# Patient Record
Sex: Female | Born: 1970 | Race: White | Hispanic: No | Smoking: Never smoker
Health system: Southern US, Community
[De-identification: ages and names within clinical notes are randomized; demographics above are authoritative.]

---

## 2008-10-19 ENCOUNTER — Emergency Department (HOSPITAL_COMMUNITY): Admission: EM | Admit: 2008-10-19 | Discharge: 2008-10-19 | Payer: Self-pay | Admitting: Family Medicine

## 2009-01-21 ENCOUNTER — Emergency Department (HOSPITAL_COMMUNITY): Admission: EM | Admit: 2009-01-21 | Discharge: 2009-01-21 | Payer: Self-pay | Admitting: Emergency Medicine

## 2009-03-25 ENCOUNTER — Emergency Department (HOSPITAL_COMMUNITY): Admission: EM | Admit: 2009-03-25 | Discharge: 2009-03-25 | Payer: Self-pay | Admitting: Emergency Medicine

## 2011-02-26 LAB — POCT URINALYSIS DIP (DEVICE)
Bilirubin Urine: NEGATIVE
Glucose, UA: NEGATIVE mg/dL
Ketones, ur: NEGATIVE mg/dL
Nitrite: POSITIVE — AB

## 2011-02-26 LAB — POCT PREGNANCY, URINE: Preg Test, Ur: NEGATIVE

## 2011-02-28 LAB — POCT URINALYSIS DIP (DEVICE)
Glucose, UA: NEGATIVE mg/dL
Ketones, ur: NEGATIVE mg/dL
Specific Gravity, Urine: 1.015 (ref 1.005–1.030)

## 2018-01-15 ENCOUNTER — Other Ambulatory Visit: Payer: Self-pay | Admitting: Family Medicine

## 2018-01-15 DIAGNOSIS — Z1231 Encounter for screening mammogram for malignant neoplasm of breast: Secondary | ICD-10-CM

## 2018-02-04 ENCOUNTER — Ambulatory Visit
Admission: RE | Admit: 2018-02-04 | Discharge: 2018-02-04 | Disposition: A | Discharge status: Home / Self Care | Payer: Medicaid Other | Source: Ambulatory Visit | Attending: Family Medicine | Admitting: Family Medicine

## 2018-02-04 DIAGNOSIS — Z1231 Encounter for screening mammogram for malignant neoplasm of breast: Secondary | ICD-10-CM

## 2018-02-05 ENCOUNTER — Other Ambulatory Visit: Payer: Self-pay | Admitting: Family Medicine

## 2018-02-05 DIAGNOSIS — R928 Other abnormal and inconclusive findings on diagnostic imaging of breast: Secondary | ICD-10-CM

## 2018-02-10 ENCOUNTER — Ambulatory Visit
Admission: RE | Admit: 2018-02-10 | Discharge: 2018-02-10 | Disposition: A | Discharge status: Home / Self Care | Payer: Medicaid Other | Source: Ambulatory Visit | Attending: Family Medicine | Admitting: Family Medicine

## 2018-02-10 DIAGNOSIS — R928 Other abnormal and inconclusive findings on diagnostic imaging of breast: Secondary | ICD-10-CM

## 2018-03-26 ENCOUNTER — Emergency Department (HOSPITAL_COMMUNITY): Payer: Medicaid Other | Admitting: Anesthesiology

## 2018-03-26 ENCOUNTER — Emergency Department (HOSPITAL_COMMUNITY): Payer: Medicaid Other

## 2018-03-26 ENCOUNTER — Other Ambulatory Visit: Payer: Self-pay

## 2018-03-26 ENCOUNTER — Inpatient Hospital Stay (HOSPITAL_COMMUNITY): Payer: Medicaid Other

## 2018-03-26 ENCOUNTER — Encounter (HOSPITAL_COMMUNITY)
Admission: EM | Disposition: A | Discharge status: Home / Self Care | Payer: Self-pay | Source: Home / Self Care | Attending: Orthopedic Surgery

## 2018-03-26 ENCOUNTER — Inpatient Hospital Stay (HOSPITAL_COMMUNITY)
Admission: EM | Admit: 2018-03-26 | Discharge: 2018-03-29 | DRG: 494 | Disposition: A | Discharge status: Home / Self Care | Payer: Medicaid Other | Attending: Orthopedic Surgery | Admitting: Orthopedic Surgery

## 2018-03-26 ENCOUNTER — Encounter (HOSPITAL_COMMUNITY): Payer: Self-pay

## 2018-03-26 DIAGNOSIS — Z09 Encounter for follow-up examination after completed treatment for conditions other than malignant neoplasm: Secondary | ICD-10-CM

## 2018-03-26 DIAGNOSIS — W19XXXA Unspecified fall, initial encounter: Secondary | ICD-10-CM | POA: Diagnosis present

## 2018-03-26 DIAGNOSIS — S82209A Unspecified fracture of shaft of unspecified tibia, initial encounter for closed fracture: Secondary | ICD-10-CM

## 2018-03-26 DIAGNOSIS — S82251A Displaced comminuted fracture of shaft of right tibia, initial encounter for closed fracture: Secondary | ICD-10-CM | POA: Diagnosis present

## 2018-03-26 HISTORY — PX: TIBIA IM NAIL INSERTION: SHX2516

## 2018-03-26 LAB — BASIC METABOLIC PANEL
ANION GAP: 10 (ref 5–15)
BUN: 8 mg/dL (ref 6–20)
CHLORIDE: 110 mmol/L (ref 101–111)
CO2: 21 mmol/L — ABNORMAL LOW (ref 22–32)
Calcium: 8.6 mg/dL — ABNORMAL LOW (ref 8.9–10.3)
Creatinine, Ser: 0.87 mg/dL (ref 0.44–1.00)
GFR calc Af Amer: >60 mL/min (ref >60)
Glucose, Bld: 102 mg/dL — ABNORMAL HIGH (ref 65–99)
POTASSIUM: 4 mmol/L (ref 3.5–5.1)
SODIUM: 141 mmol/L (ref 135–145)

## 2018-03-26 LAB — CBC
HCT: 35.1 % — ABNORMAL LOW (ref 36.0–46.0)
HEMOGLOBIN: 11.7 g/dL — AB (ref 12.0–15.0)
MCH: 32.8 pg (ref 26.0–34.0)
MCHC: 33.3 g/dL (ref 30.0–36.0)
MCV: 98.3 fL (ref 78.0–100.0)
PLATELETS: 292 10*3/uL (ref 150–400)
RBC: 3.57 MIL/uL — AB (ref 3.87–5.11)
RDW: 12.7 % (ref 11.5–15.5)
WBC: 5.7 10*3/uL (ref 4.0–10.5)

## 2018-03-26 LAB — ABO/RH: ABO/RH(D): A POS

## 2018-03-26 LAB — TYPE AND SCREEN
ABO/RH(D): A POS
ANTIBODY SCREEN: NEGATIVE

## 2018-03-26 LAB — MRSA PCR SCREENING: MRSA by PCR: NEGATIVE

## 2018-03-26 SURGERY — INSERTION, INTRAMEDULLARY ROD, TIBIA
Anesthesia: General | Site: Leg Lower | Laterality: Right

## 2018-03-26 MED ORDER — DEXAMETHASONE SODIUM PHOSPHATE 10 MG/ML IJ SOLN
INTRAMUSCULAR | Status: AC
  Filled 2018-03-26: qty 1

## 2018-03-26 MED ORDER — LIDOCAINE 2% (20 MG/ML) 5 ML SYRINGE
INTRAMUSCULAR | Status: DC | PRN
  Administered 2018-03-26: 60 mg via INTRAVENOUS

## 2018-03-26 MED ORDER — FENTANYL CITRATE (PF) 250 MCG/5ML IJ SOLN
INTRAMUSCULAR | Status: AC
  Filled 2018-03-26: qty 5

## 2018-03-26 MED ORDER — STERILE WATER FOR IRRIGATION IR SOLN
Status: DC | PRN
  Administered 2018-03-26: 2000 mL

## 2018-03-26 MED ORDER — METHOCARBAMOL 1000 MG/10ML IJ SOLN
500.0000 mg | Freq: Four times a day (QID) | INTRAVENOUS | Status: DC | PRN
  Administered 2018-03-26: 500 mg via INTRAVENOUS
  Filled 2018-03-26: qty 550

## 2018-03-26 MED ORDER — HYDROMORPHONE HCL 1 MG/ML IJ SOLN
INTRAMUSCULAR | Status: AC
  Filled 2018-03-26: qty 1

## 2018-03-26 MED ORDER — PROPOFOL 10 MG/ML IV BOLUS
INTRAVENOUS | Status: DC | PRN
  Administered 2018-03-26: 150 mg via INTRAVENOUS

## 2018-03-26 MED ORDER — MIDAZOLAM HCL 2 MG/2ML IJ SOLN
INTRAMUSCULAR | Status: AC
  Filled 2018-03-26: qty 2

## 2018-03-26 MED ORDER — ENOXAPARIN SODIUM 40 MG/0.4ML ~~LOC~~ SOLN
40.0000 mg | SUBCUTANEOUS | Status: DC
  Administered 2018-03-28 – 2018-03-29 (×2): 40 mg via SUBCUTANEOUS
  Filled 2018-03-26 (×2): qty 0.4

## 2018-03-26 MED ORDER — KETOROLAC TROMETHAMINE 15 MG/ML IJ SOLN
15.0000 mg | Freq: Four times a day (QID) | INTRAMUSCULAR | Status: AC | PRN
  Administered 2018-03-27 – 2018-03-28 (×4): 15 mg via INTRAVENOUS
  Filled 2018-03-26 (×4): qty 1

## 2018-03-26 MED ORDER — OXYCODONE HCL 5 MG PO TABS: 5.0000 mg | ORAL_TABLET | Freq: Once | ORAL | Status: DC | PRN

## 2018-03-26 MED ORDER — BUPIVACAINE-EPINEPHRINE (PF) 0.5% -1:200000 IJ SOLN
INTRAMUSCULAR | Status: DC | PRN
  Administered 2018-03-26: 25 mL via PERINEURAL

## 2018-03-26 MED ORDER — ONDANSETRON HCL 4 MG/2ML IJ SOLN: 4.0000 mg | Freq: Four times a day (QID) | INTRAMUSCULAR | Status: DC | PRN

## 2018-03-26 MED ORDER — HYDROMORPHONE HCL 1 MG/ML IJ SOLN
1.0000 mg | Freq: Once | INTRAMUSCULAR | Status: AC
  Administered 2018-03-26: 1 mg via INTRAVENOUS
  Filled 2018-03-26: qty 1

## 2018-03-26 MED ORDER — CEFAZOLIN SODIUM-DEXTROSE 2-4 GM/100ML-% IV SOLN
2.0000 g | Freq: Four times a day (QID) | INTRAVENOUS | Status: AC
  Administered 2018-03-26 – 2018-03-27 (×3): 2 g via INTRAVENOUS
  Filled 2018-03-26 (×3): qty 100

## 2018-03-26 MED ORDER — OXYCODONE HCL 5 MG/5ML PO SOLN
5.0000 mg | Freq: Once | ORAL | Status: DC | PRN
  Filled 2018-03-26: qty 5

## 2018-03-26 MED ORDER — ONDANSETRON HCL 4 MG/2ML IJ SOLN
INTRAMUSCULAR | Status: DC | PRN
  Administered 2018-03-26: 4 mg via INTRAVENOUS

## 2018-03-26 MED ORDER — ONDANSETRON HCL 4 MG/2ML IJ SOLN
INTRAMUSCULAR | Status: AC
  Filled 2018-03-26: qty 2

## 2018-03-26 MED ORDER — LIDOCAINE 2% (20 MG/ML) 5 ML SYRINGE
INTRAMUSCULAR | Status: AC
  Filled 2018-03-26: qty 5

## 2018-03-26 MED ORDER — METOCLOPRAMIDE HCL 5 MG/ML IJ SOLN: 5.0000 mg | Freq: Three times a day (TID) | INTRAMUSCULAR | Status: DC | PRN

## 2018-03-26 MED ORDER — CEFAZOLIN SODIUM-DEXTROSE 2-4 GM/100ML-% IV SOLN
2.0000 g | INTRAVENOUS | Status: AC
  Administered 2018-03-26: 2 g via INTRAVENOUS

## 2018-03-26 MED ORDER — HYDROMORPHONE HCL 1 MG/ML IJ SOLN
0.2500 mg | INTRAMUSCULAR | Status: DC | PRN
  Administered 2018-03-26 (×4): 0.5 mg via INTRAVENOUS

## 2018-03-26 MED ORDER — SUCCINYLCHOLINE CHLORIDE 200 MG/10ML IV SOSY
PREFILLED_SYRINGE | INTRAVENOUS | Status: AC
  Filled 2018-03-26: qty 10

## 2018-03-26 MED ORDER — SENNA 8.6 MG PO TABS
1.0000 | ORAL_TABLET | Freq: Two times a day (BID) | ORAL | Status: DC
  Administered 2018-03-26 – 2018-03-29 (×6): 8.6 mg via ORAL
  Filled 2018-03-26 (×6): qty 1

## 2018-03-26 MED ORDER — PHENYLEPHRINE 40 MCG/ML (10ML) SYRINGE FOR IV PUSH (FOR BLOOD PRESSURE SUPPORT)
PREFILLED_SYRINGE | INTRAVENOUS | Status: AC
  Filled 2018-03-26: qty 10

## 2018-03-26 MED ORDER — ONDANSETRON HCL 4 MG/2ML IJ SOLN
4.0000 mg | Freq: Once | INTRAMUSCULAR | Status: AC
  Administered 2018-03-26: 4 mg via INTRAVENOUS

## 2018-03-26 MED ORDER — SODIUM CHLORIDE 0.9 % IV SOLN
INTRAVENOUS | Status: DC
  Administered 2018-03-26 – 2018-03-27 (×2): via INTRAVENOUS

## 2018-03-26 MED ORDER — METOCLOPRAMIDE HCL 5 MG PO TABS: 5.0000 mg | ORAL_TABLET | Freq: Three times a day (TID) | ORAL | Status: DC | PRN

## 2018-03-26 MED ORDER — MORPHINE SULFATE (PF) 2 MG/ML IV SOLN
0.5000 mg | INTRAVENOUS | Status: DC | PRN
  Administered 2018-03-26 – 2018-03-27 (×3): 1 mg via INTRAVENOUS
  Filled 2018-03-26 (×3): qty 1

## 2018-03-26 MED ORDER — ROCURONIUM BROMIDE 10 MG/ML (PF) SYRINGE
PREFILLED_SYRINGE | INTRAVENOUS | Status: AC
  Filled 2018-03-26: qty 5

## 2018-03-26 MED ORDER — METHOCARBAMOL 500 MG PO TABS
500.0000 mg | ORAL_TABLET | Freq: Four times a day (QID) | ORAL | Status: DC | PRN
  Administered 2018-03-26 – 2018-03-29 (×10): 500 mg via ORAL
  Filled 2018-03-26 (×10): qty 1

## 2018-03-26 MED ORDER — DOCUSATE SODIUM 100 MG PO CAPS
100.0000 mg | ORAL_CAPSULE | Freq: Two times a day (BID) | ORAL | Status: DC
  Administered 2018-03-26 – 2018-03-29 (×6): 100 mg via ORAL
  Filled 2018-03-26 (×6): qty 1

## 2018-03-26 MED ORDER — CHLORHEXIDINE GLUCONATE 4 % EX LIQD
60.0000 mL | Freq: Once | CUTANEOUS | Status: DC
  Filled 2018-03-26: qty 60

## 2018-03-26 MED ORDER — FENTANYL CITRATE (PF) 100 MCG/2ML IJ SOLN
INTRAMUSCULAR | Status: AC
  Filled 2018-03-26: qty 2

## 2018-03-26 MED ORDER — FENTANYL CITRATE (PF) 100 MCG/2ML IJ SOLN
INTRAMUSCULAR | Status: DC | PRN
  Administered 2018-03-26 (×5): 50 ug via INTRAVENOUS

## 2018-03-26 MED ORDER — PHENYLEPHRINE 40 MCG/ML (10ML) SYRINGE FOR IV PUSH (FOR BLOOD PRESSURE SUPPORT)
PREFILLED_SYRINGE | INTRAVENOUS | Status: DC | PRN
  Administered 2018-03-26 (×4): 80 ug via INTRAVENOUS

## 2018-03-26 MED ORDER — DIPHENHYDRAMINE HCL 12.5 MG/5ML PO ELIX: 12.5000 mg | ORAL_SOLUTION | ORAL | Status: DC | PRN

## 2018-03-26 MED ORDER — PHENYLEPHRINE HCL 10 MG/ML IJ SOLN
INTRAMUSCULAR | Status: AC
  Filled 2018-03-26: qty 1

## 2018-03-26 MED ORDER — POVIDONE-IODINE 10 % EX SWAB: 2.0000 "application " | Freq: Once | CUTANEOUS | Status: DC

## 2018-03-26 MED ORDER — MORPHINE SULFATE (PF) 4 MG/ML IV SOLN
6.0000 mg | Freq: Once | INTRAVENOUS | Status: AC
  Administered 2018-03-26: 6 mg via INTRAVENOUS
  Filled 2018-03-26: qty 2

## 2018-03-26 MED ORDER — HYDROCODONE-ACETAMINOPHEN 7.5-325 MG PO TABS
1.0000 | ORAL_TABLET | ORAL | Status: DC | PRN
  Administered 2018-03-27 – 2018-03-28 (×8): 2 via ORAL
  Filled 2018-03-26 (×8): qty 2

## 2018-03-26 MED ORDER — HYDROMORPHONE HCL 1 MG/ML IJ SOLN
0.2500 mg | INTRAMUSCULAR | Status: DC | PRN
  Administered 2018-03-26 (×2): 0.5 mg via INTRAVENOUS

## 2018-03-26 MED ORDER — CEFAZOLIN SODIUM-DEXTROSE 2-4 GM/100ML-% IV SOLN
INTRAVENOUS | Status: AC
  Filled 2018-03-26: qty 100

## 2018-03-26 MED ORDER — PROPOFOL 10 MG/ML IV BOLUS
INTRAVENOUS | Status: AC
  Filled 2018-03-26: qty 20

## 2018-03-26 MED ORDER — ONDANSETRON HCL 4 MG PO TABS
4.0000 mg | ORAL_TABLET | Freq: Four times a day (QID) | ORAL | Status: DC | PRN
  Administered 2018-03-28: 4 mg via ORAL
  Filled 2018-03-26: qty 1

## 2018-03-26 MED ORDER — HYDROCODONE-ACETAMINOPHEN 5-325 MG PO TABS
1.0000 | ORAL_TABLET | ORAL | Status: DC | PRN
  Administered 2018-03-26: 2 via ORAL
  Filled 2018-03-26: qty 2

## 2018-03-26 MED ORDER — LACTATED RINGERS IV SOLN
INTRAVENOUS | Status: DC
  Administered 2018-03-26: 1000 mL via INTRAVENOUS
  Administered 2018-03-26: 14:00:00 via INTRAVENOUS

## 2018-03-26 MED ORDER — ACETAMINOPHEN 325 MG PO TABS
325.0000 mg | ORAL_TABLET | Freq: Four times a day (QID) | ORAL | Status: DC | PRN
  Administered 2018-03-28 – 2018-03-29 (×2): 650 mg via ORAL
  Filled 2018-03-26 (×2): qty 2

## 2018-03-26 SURGICAL SUPPLY — 55 items
BAG ZIPLOCK 12X15 (MISCELLANEOUS) ×3 IMPLANT
BANDAGE ACE 4X5 VEL STRL LF (GAUZE/BANDAGES/DRESSINGS) ×3 IMPLANT
BANDAGE ACE 6X5 VEL STRL LF (GAUZE/BANDAGES/DRESSINGS) ×3 IMPLANT
BIT DRILL 3.8X6 NS (BIT) ×3 IMPLANT
BIT DRILL 4.4 NS (BIT) ×3 IMPLANT
CHLORAPREP W/TINT 26ML (MISCELLANEOUS) ×6 IMPLANT
COVER SURGICAL LIGHT HANDLE (MISCELLANEOUS) ×3 IMPLANT
CUFF TOURN SGL QUICK 34 (TOURNIQUET CUFF)
CUFF TRNQT CYL 34X4X40X1 (TOURNIQUET CUFF) IMPLANT
DRAPE C-ARM 42X120 X-RAY (DRAPES) ×3 IMPLANT
DRAPE C-ARMOR (DRAPES) ×3 IMPLANT
DRAPE EXTREMITY TIBURON (DRAPES) ×3 IMPLANT
DRAPE POUCH INSTRU U-SHP 10X18 (DRAPES) ×3 IMPLANT
DRAPE SHEET LG 3/4 BI-LAMINATE (DRAPES) ×6 IMPLANT
DRAPE U-SHAPE 47X51 STRL (DRAPES) ×3 IMPLANT
DRSG EMULSION OIL 3X3 NADH (GAUZE/BANDAGES/DRESSINGS) ×3 IMPLANT
DRSG MEPILEX BORDER 4X4 (GAUZE/BANDAGES/DRESSINGS) IMPLANT
DRSG MEPILEX BORDER 4X8 (GAUZE/BANDAGES/DRESSINGS) IMPLANT
DRSG PAD ABDOMINAL 8X10 ST (GAUZE/BANDAGES/DRESSINGS) ×9 IMPLANT
ELECT BLADE TIP CTD 4 INCH (ELECTRODE) ×3 IMPLANT
ELECT REM PT RETURN 15FT ADLT (MISCELLANEOUS) ×3 IMPLANT
END CAP UNIVERSAL 5MM (Trauma) ×3 IMPLANT
FACESHIELD WRAPAROUND (MASK) ×9 IMPLANT
GAUZE SPONGE 4X4 12PLY STRL (GAUZE/BANDAGES/DRESSINGS) ×3 IMPLANT
GLOVE BIO SURGEON STRL SZ8.5 (GLOVE) ×6 IMPLANT
GLOVE BIOGEL PI IND STRL 8.5 (GLOVE) ×1 IMPLANT
GLOVE BIOGEL PI INDICATOR 8.5 (GLOVE) ×2
GOWN SPEC L3 XXLG W/TWL (GOWN DISPOSABLE) ×3 IMPLANT
GOWN STRL REUS W/TWL LRG LVL3 (GOWN DISPOSABLE) ×3 IMPLANT
GUIDEPIN 3.2X17.5 THRD DISP (PIN) ×3 IMPLANT
GUIDEWIRE BALL NOSE 80CM (WIRE) ×3 IMPLANT
MANIFOLD NEPTUNE II (INSTRUMENTS) ×3 IMPLANT
NAIL TIBIAL 8MMX30CM (Nail) ×3 IMPLANT
NS IRRIG 1000ML POUR BTL (IV SOLUTION) ×3 IMPLANT
PACK TOTAL JOINT (CUSTOM PROCEDURE TRAY) ×3 IMPLANT
PAD CAST 4YDX4 CTTN HI CHSV (CAST SUPPLIES) ×1 IMPLANT
PADDING CAST ABS 6INX4YD NS (CAST SUPPLIES) ×2
PADDING CAST ABS COTTON 6X4 NS (CAST SUPPLIES) ×1 IMPLANT
PADDING CAST COTTON 4X4 STRL (CAST SUPPLIES) ×2
PADDING CAST COTTON 6X4 STRL (CAST SUPPLIES) ×6 IMPLANT
POSITIONER SURGICAL ARM (MISCELLANEOUS) ×3 IMPLANT
SCREW ACECAP 32MM (Screw) ×3 IMPLANT
SCREW ACECAP 38MM (Screw) ×3 IMPLANT
SCREW CORTICAL 5.5 35MM (Screw) ×3 IMPLANT
SCREW LCKING CORT 5.5X30 NSTRL (Screw) ×3 IMPLANT
SCREW PROXIMAL DEPUY (Screw) ×2 IMPLANT
SCREW PRXML FT 45X5.5XLCK NS (Screw) ×1 IMPLANT
STAPLER VISISTAT 35W (STAPLE) ×3 IMPLANT
SUT VIC AB 1 CT1 27 (SUTURE) ×4
SUT VIC AB 1 CT1 27XBRD ANTBC (SUTURE) ×2 IMPLANT
SUT VIC AB 2-0 CT1 27 (SUTURE) ×4
SUT VIC AB 2-0 CT1 TAPERPNT 27 (SUTURE) ×2 IMPLANT
TOWEL OR 17X26 10 PK STRL BLUE (TOWEL DISPOSABLE) ×6 IMPLANT
WATER STERILE IRR 1000ML POUR (IV SOLUTION) ×6 IMPLANT
WRAP KNEE MAXI GEL POST OP (GAUZE/BANDAGES/DRESSINGS) ×3 IMPLANT
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
## 2018-03-26 NOTE — Op Note (Signed)
OPERATIVE REPORT   03/26/2018  2:02 PM  PATIENT:  Sally Myers   SURGEON:  Jonette Pesa, MD  ASSISTANT: Skip Mayer, PA-C.   PREOPERATIVE DIAGNOSIS: Right tibia shaft fracture.  POSTOPERATIVE DIAGNOSIS:  Same.  PROCEDURE: Intramedullary fixation of right tibia.  ANESTHESIA:   GETA.  ANTIBIOTICS: 2 g Ancef.  IMPLANTS: Biomet versa nail tibial nail size 8 x 300 mm. 5 mm endcap. 4.5 mm distal interlocking screw x2. 5.5 proximal interlocking screw x2.  SPECIMENS: None.  COMPLICATIONS: None.  DISPOSITION: Stable to PACU.  SURGICAL INDICATIONS:  Octa Uplinger is a 47 y.o. female with a diagnosis of closed right tibia fracture.  We discussed the risks, benefits, and alternatives to surgical treatment.  The risks, benefits, and alternatives were discussed with the patient preoperatively including but not limited to the risks of infection, bleeding, nerve / blood vessel injury, malunion, nonunion, cardiopulmonary complications, the need for repeat surgery, among others, and the patient was willing to proceed.  PROCEDURE IN DETAIL: The patient was identified in the preop holding area using 2 identifiers.  Popliteal block was placed by anesthesia.  The patient was taken to the operating room, and placed supine on the operating room table.  General anesthesia was induced.  All bony prominences were well-padded.  She was placed in the semi-extended position on a bone foam positioner.  The right lower extremity was prepped and draped in a normal sterile surgical fashion.  Timeout was called, verifying site and site of surgery.  I began by making a longitudinal incision over the lateral edge of the patella.  Full-thickness skin flaps were created.  I created a limited lateral parapatellar arthrotomy.  Blunt dissection was performed to identify the interval between the patellar tendon and fat pad.  I used the awl to determine the standard starting point for a tibial nail.  Guidepin was  advanced.  Entry reamer was used.  The fracture was reduced with traction and rotation.  Clamp was placed percutaneously to help hold the reduction.  Reduction was confirmed with AP and lateral fluoroscopy views.  Guidewire was placed to the physeal scar at the ankle.  Guidewire was measured using lateral fluoroscopy at the knee.  I sequentially reamed up to a 9.5 mm reamer with excellent chatter.  Therefore an 8 x 300 mm nail was selected.  The nail was assembled on the jig on the back table.  The nail was passed without any difficulty.  Using perfect circle technique with fluoroscopy, I placed 2 distal interlocking screws.  Proximally, 2 interlocking screws were placed, taking care to protect the peroneal nerve.  I removed the jig.  An end cap was placed.  Final AP and lateral fluoroscopy views were obtained of the knee, fracture site, and ankle to confirm fracture reduction and hardware placement.  The wounds were copiously irrigated with saline.  The knee joint was additionally irrigated with copious saline.  The arthrotomy was closed with #1 Vicryl.  Deep dermal closure was performed with 2-0 interrupted Vicryl, staples for the skin.  At the end of the case, there is very minimal swelling.  Compartments were soft and compressible.  Sterile dressing followed by a bulky Jones dressing were placed.  She was extubated, transferred to the stretcher, and taken to the PACU in stable condition.  Sponge, needle, and instrument counts were correct at the end of the case x2.  There were no known complications.  POSTOPERATIVE PLAN: Postoperatively, she will be admitted to the hospital.  She  will have compartment checks.  Touchdown weightbearing right lower extremity with a walker.  Lovenox in-house, aspirin upon discharge for DVT prophylaxis.  She will work with physical therapy.  She will undergo disposition planning.

## 2018-03-26 NOTE — Progress Notes (Signed)
Assisted Dr. Hodierne with right, ultrasound guided, popliteal block. Side rails up, monitors on throughout procedure. See vital signs in flow sheet. Tolerated Procedure well. 
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
## 2018-03-26 NOTE — Discharge Instructions (Signed)
Touchdown weightbearing right lower extremity. Apply ice for 20 minutes to the right lower extremity every 2 hours. Elevate toes above nose on blanket/pillows to help with swelling.

## 2018-03-26 NOTE — ED Notes (Signed)
Pt reports drinking about 3 glasses of wine and using marijuana before EMS arrived.

## 2018-03-26 NOTE — Anesthesia Preprocedure Evaluation (Signed)
Anesthesia Evaluation  Patient identified by MRN, date of birth, ID band Patient awake    Reviewed: Allergy & Precautions, H&P , NPO status , Patient's Chart, lab work & pertinent test results  Airway Mallampati: II   Neck ROM: full    Dental   Pulmonary neg pulmonary ROS,    breath sounds clear to auscultation       Cardiovascular negative cardio ROS   Rhythm:regular Rate:Normal     Neuro/Psych    GI/Hepatic   Endo/Other    Renal/GU      Musculoskeletal   Abdominal   Peds  Hematology   Anesthesia Other Findings   Reproductive/Obstetrics                             Anesthesia Physical Anesthesia Plan  ASA: I  Anesthesia Plan: General   Post-op Pain Management:  Regional for Post-op pain   Induction: Intravenous  PONV Risk Score and Plan: 3 and Ondansetron, Dexamethasone, Midazolam, Treatment may vary due to age or medical condition and Scopolamine patch - Pre-op  Airway Management Planned: LMA  Additional Equipment:   Intra-op Plan:   Post-operative Plan:   Informed Consent: I have reviewed the patients History and Physical, chart, labs and discussed the procedure including the risks, benefits and alternatives for the proposed anesthesia with the patient or authorized representative who has indicated his/her understanding and acceptance.     Plan Discussed with: CRNA, Anesthesiologist and Surgeon  Anesthesia Plan Comments:         Anesthesia Quick Evaluation
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
## 2018-03-26 NOTE — Plan of Care (Signed)
Plan of care 

## 2018-03-26 NOTE — Anesthesia Procedure Notes (Signed)
Anesthesia Regional Block: Popliteal block   Pre-Anesthetic Checklist: ,, timeout performed, Correct Patient, Correct Site, Correct Laterality, Correct Procedure, Correct Position, site marked, Risks and benefits discussed,  Surgical consent,  Pre-op evaluation,  At surgeon's request and post-op pain management  Laterality: Right  Prep: chloraprep       Needles:  Injection technique: Single-shot  Needle Type: Echogenic Stimulator Needle          Additional Needles:   Procedures:, nerve stimulator,,,,,,,   Nerve Stimulator or Paresthesia:  Response: plantar flexion of foot, 0.45 mA,   Additional Responses:   Narrative:  Start time: 03/26/2018 11:50 AM End time: 03/26/2018 11:58 AM Injection made incrementally with aspirations every 5 mL.  Performed by: Personally   Additional Notes: Functioning IV was confirmed and monitors were applied.  A 90mm 21ga Arrow echogenic stimulator needle was used. Sterile prep and drape,hand hygiene and sterile gloves were used.  Negative aspiration and negative test dose prior to incremental administration of local anesthetic. The patient tolerated the procedure well.  Ultrasound guidance: relevent anatomy identified, needle position confirmed, local anesthetic spread visualized around nerve(s), vascular puncture avoided.  Image printed for medical record.

## 2018-03-26 NOTE — Transfer of Care (Signed)
Immediate Anesthesia Transfer of Care Note  Patient: Sally Myers  Procedure(s) Performed: INTRAMEDULLARY (IM) NAIL TIBIAL (Right Leg Lower)  Patient Location: PACU  Anesthesia Type:General  Level of Consciousness: awake, alert  and oriented  Airway & Oxygen Therapy: Patient Spontanous Breathing and Patient connected to face mask oxygen  Post-op Assessment: Report given to RN and Post -op Vital signs reviewed and stable  Post vital signs: Reviewed and stable  Last Vitals:  Vitals Value Taken Time  BP 136/89 03/26/2018  2:19 PM  Temp    Pulse 125 03/26/2018  2:22 PM  Resp 17 03/26/2018  2:22 PM  SpO2 100 % 03/26/2018  2:22 PM  Vitals shown include unvalidated device data.  Last Pain:  Vitals:   03/26/18 1209  TempSrc:   PainSc: Asleep      Patients Stated Pain Goal: 4 (03/26/18 1209)  Complications: No apparent anesthesia complications

## 2018-03-26 NOTE — Anesthesia Procedure Notes (Signed)
Procedure Name: LMA Insertion Date/Time: 03/26/2018 12:23 PM Performed by: Orest Dikes, CRNA Pre-anesthesia Checklist: Patient identified, Emergency Drugs available, Suction available and Patient being monitored Patient Re-evaluated:Patient Re-evaluated prior to induction Oxygen Delivery Method: Circle system utilized Preoxygenation: Pre-oxygenation with 100% oxygen Induction Type: IV induction LMA: LMA inserted LMA Size: 4.0 Number of attempts: 1 Placement Confirmation: positive ETCO2 and breath sounds checked- equal and bilateral Tube secured with: Tape Dental Injury: Teeth and Oropharynx as per pre-operative assessment

## 2018-03-27 ENCOUNTER — Encounter (HOSPITAL_COMMUNITY): Payer: Self-pay | Admitting: Orthopedic Surgery

## 2018-03-27 MED ORDER — ASPIRIN EC 325 MG PO TBEC: 325.0000 mg | DELAYED_RELEASE_TABLET | Freq: Two times a day (BID) | ORAL | 0 refills | Status: AC

## 2018-03-27 MED ORDER — HYDROMORPHONE HCL 1 MG/ML IJ SOLN
1.0000 mg | INTRAMUSCULAR | Status: DC | PRN
  Administered 2018-03-27 – 2018-03-28 (×6): 1 mg via INTRAVENOUS
  Filled 2018-03-27 (×7): qty 1

## 2018-03-27 MED ORDER — HYDROCODONE-ACETAMINOPHEN 7.5-325 MG PO TABS: 1.0000 | ORAL_TABLET | ORAL | 0 refills | Status: DC | PRN

## 2018-03-27 MED ORDER — ONDANSETRON HCL 4 MG PO TABS: 4.0000 mg | ORAL_TABLET | Freq: Four times a day (QID) | ORAL | 0 refills | Status: AC | PRN

## 2018-03-27 MED ORDER — SENNA 8.6 MG PO TABS: 2.0000 | ORAL_TABLET | Freq: Every day | ORAL | 3 refills | Status: AC

## 2018-03-27 MED ORDER — DOCUSATE SODIUM 100 MG PO CAPS: 100.0000 mg | ORAL_CAPSULE | Freq: Two times a day (BID) | ORAL | 1 refills | Status: AC

## 2018-03-27 MED ORDER — SENNA 8.6 MG PO TABS: 1.0000 | ORAL_TABLET | Freq: Two times a day (BID) | ORAL | 0 refills | Status: AC

## 2018-03-27 NOTE — Evaluation (Signed)
Physical Therapy Evaluation Patient Details Name: Sally Myers MRN: [id number] DOB: 1971/08/19 Today's Date: 03/27/2018   History of Present Illness  s/p R ORIF tib/fib fx; PMHx: drug use  Clinical Impression  Pt admitted with above diagnosis. Pt currently with functional limitations due to the deficits listed below (see PT Problem List).  Pt doing well, anxious regarding mobility but amb 30' this am; will continue to follow, should be ready for d/c in am if pain remains controlled;  Pt will benefit from skilled PT to increase their independence and safety with mobility to allow discharge to the venue listed below.       Follow Up Recommendations No PT follow up    Equipment Recommendations  Rolling walker with 5" wheels    Recommendations for Other Services       Precautions / Restrictions Precautions Precautions: Fall Restrictions RLE Weight Bearing: Touchdown weight bearing      Mobility  Bed Mobility Overal bed mobility: Needs Assistance Bed Mobility: Supine to Sit     Supine to sit: Supervision     General bed mobility comments: for safety, incr time  Transfers Overall transfer level: Needs assistance Equipment used: Rolling walker (2 wheeled) Transfers: Sit to/from Stand Sit to Stand: Min guard         General transfer comment: cues for hand placement and TDWB  Ambulation/Gait Ambulation/Gait assistance: Min guard Ambulation Distance (Feet): 30 Feet Assistive device: Rolling walker (2 wheeled)       General Gait Details: cues for sequence,  and TDWB, slow but steady gait  Stairs            Wheelchair Mobility    Modified Rankin (Stroke Patients Only)       Balance Overall balance assessment: Needs assistance Sitting-balance support: Feet supported;No upper extremity supported Sitting balance-Leahy Scale: Good       Standing balance-Leahy Scale: Poor Standing balance comment: reliant on UE support                            Pertinent Vitals/Pain Pain Assessment: 0-10 Pain Score: 3  Pain Location: right ankle/leg Pain Descriptors / Indicators: Aching;Throbbing Pain Intervention(s): Limited activity within patient's tolerance;Monitored during session;Premedicated before session;Repositioned    Home Living Family/patient expects to be discharged to:: Private residence Living Arrangements: Children(17 yo with mental challenges)   Type of Home: House(townhouse) Home Access: Stairs to enter Entrance Stairs-Rails: Dealer of Steps: 2 Home Layout: Two level   Additional Comments: borrowed crutches after last injury(patella fx)    Prior Function Level of Independence: Independent               Hand Dominance        Extremity/Trunk Assessment   Upper Extremity Assessment Upper Extremity Assessment: Overall WFL for tasks assessed    Lower Extremity Assessment Lower Extremity Assessment: RLE deficits/detail RLE Deficits / Details: knee flexion to 90* AROM; hip grossly WFL RLE: Unable to fully assess due to immobilization;Unable to fully assess due to pain       Communication   Communication: No difficulties  Cognition Arousal/Alertness: Awake/alert Behavior During Therapy: WFL for tasks assessed/performed Overall Cognitive Status: Within Functional Limits for tasks assessed                                        General Comments  Exercises     Assessment/Plan    PT Assessment Patient needs continued PT services  PT Problem List Decreased balance;Decreased knowledge of use of DME;Decreased mobility       PT Treatment Interventions DME instruction;Gait training;Stair training;Functional mobility training;Therapeutic activities;Therapeutic exercise;Patient/family education    PT Goals (Current goals can be found in the Care Plan section)  Acute Rehab PT Goals Patient Stated Goal: home, regain independence PT Goal Formulation:  With patient Time For Goal Achievement: 04/03/18 Potential to Achieve Goals: Good    Frequency 7X/week   Barriers to discharge        Co-evaluation               AM-PAC PT "6 Clicks" Daily Activity  Outcome Measure Difficulty turning over in bed (including adjusting bedclothes, sheets and blankets)?: A Little Difficulty moving from lying on back to sitting on the side of the bed? : A Little Difficulty sitting down on and standing up from a chair with arms (e.g., wheelchair, bedside commode, etc,.)?: A Little Help needed moving to and from a bed to chair (including a wheelchair)?: A Little Help needed walking in hospital room?: A Little Help needed climbing 3-5 steps with a railing? : A Lot 6 Click Score: 17    End of Session Equipment Utilized During Treatment: Gait belt Activity Tolerance: Patient tolerated treatment well Patient left: in chair;with call bell/phone within reach;with chair alarm set;with family/visitor present   PT Visit Diagnosis: Unsteadiness on feet (R26.81);Difficulty in walking, not elsewhere classified (R26.2)    Time: 1610-9604 PT Time Calculation (min) (ACUTE ONLY): 28 min   Charges:   PT Evaluation $PT Eval Low Complexity: 1 Low PT Treatments $Gait Training: 8-22 mins   PT G Codes:        Donata Clay 03/27/2018, 10:55 AM

## 2018-03-27 NOTE — Evaluation (Signed)
Occupational Therapy Evaluation Patient Details Name: Sally Myers MRN: [id number] DOB: 12-05-1970 Today's Date: 03/27/2018    History of Present Illness s/p R ORIF tib/fib fx; PMHx: drug use   Clinical Impression   This 47 year old female was admitted for the above injury/sx.  At baseline she is independent with adls/iadls and cares for a 52 year old son with some challenges.  Pt will benefit from continued OT in acute setting with supervision level goals.      Follow Up Recommendations  Supervision - Intermittent    Equipment Recommendations  3 in 1 bedside commode(would benefit from tub bench, if she is permitted to shower) Pt has medicaid insurance   Recommendations for Other Services       Precautions / Restrictions Precautions Precautions: Fall Restrictions RLE Weight Bearing: Touchdown weight bearing      Mobility Bed Mobility Overal bed mobility: Needs Assistance Bed Mobility: Supine to Sit     Supine to sit: Supervision     General bed mobility comments: oob  Transfers Overall transfer level: Needs assistance Equipment used: Rolling walker (2 wheeled) Transfers: Sit to/from Stand Sit to Stand: Min guard         General transfer comment: for safety    Balance Overall balance assessment: Needs assistance Sitting-balance support: Feet supported;No upper extremity supported Sitting balance-Leahy Scale: Good       Standing balance-Leahy Scale: Poor Standing balance comment: reliant on UE support                           ADL either performed or assessed with clinical judgement   ADL Overall ADL's : Needs assistance/impaired Eating/Feeding: Independent   Grooming: Set up;Sitting   Upper Body Bathing: Set up;Sitting   Lower Body Bathing: Minimal assistance;Sit to/from stand   Upper Body Dressing : Set up;Sitting   Lower Body Dressing: Minimal assistance;Sit to/from stand       Toileting- Architect and Hygiene: Min  guard;Sit to/from stand         General ADL Comments: performed adl from recliner, sit to stand. Will issued long sponge and reacher for reaching RLE.  Pt is able to bring LLE up for sock     Vision         Perception     Praxis      Pertinent Vitals/Pain Pain Assessment: 0-10 Pain Score: 3  Pain Location: right ankle/leg Pain Descriptors / Indicators: Aching;Throbbing Pain Intervention(s): Limited activity within patient's tolerance;Monitored during session;Premedicated before session;Repositioned     Hand Dominance     Extremity/Trunk Assessment Upper Extremity Assessment Upper Extremity Assessment: Overall WFL for tasks assessed          Communication Communication Communication: No difficulties   Cognition Arousal/Alertness: Awake/alert Behavior During Therapy: WFL for tasks assessed/performed Overall Cognitive Status: Within Functional Limits for tasks assessed                                     General Comments       Exercises     Shoulder Instructions      Home Living Family/patient expects to be discharged to:: Private residence Living Arrangements: Children   Type of Home: House(townhouse) Home Access: Stairs to enter [employer] of Steps: 2 Entrance Stairs-Rails: None;Right Home Layout: Two level Alternate Level Stairs-Number of Steps: 14   Bathroom Shower/Tub: Tub/shower unit  Bathroom Toilet: Standard         Additional Comments: 47 year old can do self care; home therapy team for him.  Pt does meds, meals, etc.      Prior Functioning/Environment Level of Independence: Independent                 OT Problem List: Decreased strength;Decreased activity tolerance;Decreased knowledge of use of DME or AE;Pain      OT Treatment/Interventions: Self-care/ADL training;DME and/or AE instruction;Patient/family education    OT Goals(Current goals can be found in the care plan section) Acute Rehab OT  Goals Patient Stated Goal: home, regain independence OT Goal Formulation: With patient Time For Goal Achievement: 04/10/18 Potential to Achieve Goals: Good ADL Goals Pt Will Perform Lower Body Bathing: (P) with supervision;with adaptive equipment;sit to/from stand Pt Will Perform Lower Body Dressing: (P) with supervision;with adaptive equipment;sit to/from stand Pt Will Transfer to Toilet: (P) with supervision;ambulating;bedside commode Pt Will Perform Toileting - Clothing Manipulation and hygiene: (P) with supervision;with adaptive equipment;sit to/from stand Pt Will Perform Tub/Shower Transfer: (P) Tub transfer;with min assist;tub bench;ambulating(if MD allows pt to shower)  OT Frequency: Min 2X/week   Barriers to D/C:            Co-evaluation              AM-PAC PT "6 Clicks" Daily Activity     Outcome Measure Help from another person eating meals?: None Help from another person taking care of personal grooming?: A Little Help from another person toileting, which includes using toliet, bedpan, or urinal?: A Little Help from another person bathing (including washing, rinsing, drying)?: A Little Help from another person to put on and taking off regular upper body clothing?: A Little Help from another person to put on and taking off regular lower body clothing?: A Little 6 Click Score: 19   End of Session    Activity Tolerance: Patient tolerated treatment well Patient left: in chair;with call bell/phone within reach;with chair alarm set  OT Visit Diagnosis: Pain Pain - Right/Left: Right Pain - part of body: Ankle and joints of foot                Time: 1610-9604 OT Time Calculation (min): 22 min Charges:  OT General Charges $OT Visit: 1 Visit OT Evaluation $OT Eval Low Complexity: 1 Low G-Codes:     [place], OTR/L [phone] 03/27/2018  Sally Myers 03/27/2018, 1:17 PM

## 2018-03-27 NOTE — Progress Notes (Signed)
    Subjective:  Patient reports pain as moderate to severe.  Denies N/V/CP/SOB. Pain control issues o/n to R knee, now improved. States block hasn't completely worn off.  Objective:   VITALS:   Vitals:   03/26/18 1946 03/26/18 2241 03/27/18 0141 03/27/18 0501  BP: 108/74 109/78 99/60 99/69   Pulse: 99 94 95 86  Resp:    12  Temp: 98.3 F (36.8 C) 98.2 F (36.8 C) (!) 97.5 F (36.4 C) 98.4 F (36.9 C)  TempSrc: Oral Oral Oral Oral  SpO2: 100% 100% 100% 100%  Weight:      Height:        NAD ABD soft Intact pulses distally Incision: dressing C/D/I Compartment soft No pain with passive stretch Able to wiggle toes Unable to completely assess motor/sensory due to block   Lab Results  Component Value Date   WBC 5.7 03/26/2018   HGB 11.7 (L) 03/26/2018   HCT 35.1 (L) 03/26/2018   MCV 98.3 03/26/2018   PLT 292 03/26/2018   BMET    Component Value Date/Time   NA 141 03/26/2018 0622   K 4.0 03/26/2018 0622   CL 110 03/26/2018 0622   CO2 21 (L) 03/26/2018 0622   GLUCOSE 102 (H) 03/26/2018 0622   BUN 8 03/26/2018 0622   CREATININE 0.87 03/26/2018 0622   CALCIUM 8.6 (L) 03/26/2018 0622   GFRNONAA >60 03/26/2018 0622   GFRAA >60 03/26/2018 0622     Assessment/Plan: 1 Day Post-Op   Active Problems:   Closed displaced comminuted fracture of shaft of right tibia   TDWB RLE with walker Ice, elevate toes above nose DVT ppx: lovenox in house --> home on ASA, SCDs, TEDS PO pain control PT/OT Dispo: cont to monitor for compartment syndrome, D/C home tomorrow    Sally Myers 03/27/2018, 7:46 AM   Samson Frederic, MD Cell [phone]

## 2018-03-27 NOTE — Progress Notes (Signed)
Pt c/o 10/10 pain unrelieved by current pain medication orders. Notified on-call physician. Received a call back from [facility]. New orders to discontinue IV morphine and add IV Dilaudid  Q3hrs PRN for sever pain 7-10, uncontrolled by PO analgesics.

## 2018-03-27 NOTE — Discharge Summary (Signed)
Physician Discharge Summary  Patient ID: Sally Myers MRN: [id number] DOB/AGE: [birth year] 47 y.o.  Admit date: 03/26/2018 Discharge date: 03/27/2018  Admission Diagnoses:  Closed displaced comminuted fracture of shaft of right tibia  Discharge Diagnoses:  Principal Problem:   Closed displaced comminuted fracture of shaft of right tibia   History reviewed. No pertinent past medical history.  Surgeries: Procedure(s): INTRAMEDULLARY (IM) NAIL TIBIAL on 03/26/2018   Consultants (if any): Treatment Team:  Samson Frederic, MD  Discharged Condition: Improved  Hospital Course: Sally Myers is an 47 y.o. female who was admitted 03/26/2018 with a diagnosis of Closed displaced comminuted fracture of shaft of right tibia and went to the operating room on 03/26/2018 and underwent the above named procedures.    She was given perioperative antibiotics:  Anti-infectives (From admission, onward)   Start     Dose/Rate Route Frequency Ordered Stop   03/26/18 1830  ceFAZolin (ANCEF) IVPB 2g/100 mL premix     2 g 200 mL/hr over 30 Minutes Intravenous Every 6 hours 03/26/18 1700 03/27/18 1229   03/26/18 1030  ceFAZolin (ANCEF) IVPB 2g/100 mL premix     2 g 200 mL/hr over 30 Minutes Intravenous On call to O.R. 03/26/18 1022 03/26/18 1245   03/26/18 1024  ceFAZolin (ANCEF) 2-4 GM/100ML-% IVPB    Note to Pharmacy:  Curlene Dolphin   : cabinet override      03/26/18 1024 03/26/18 1225    . She was made TDWB RLE.  She was given sequential compression devices, early ambulation, and lovenox for DVT prophylaxis.  She benefited maximally from the hospital stay and there were no complications.    Recent vital signs:  Vitals:   03/27/18 0141 03/27/18 0501  BP: 99/60 99/69  Pulse: 95 86  Resp:  12  Temp: (!) 97.5 F (36.4 C) 98.4 F (36.9 C)  SpO2: 100% 100%    Recent laboratory studies:  Lab Results  Component Value Date   HGB 11.7 (L) 03/26/2018   Lab Results  Component Value Date   WBC 5.7  03/26/2018   PLT 292 03/26/2018   No results found for: INR Lab Results  Component Value Date   NA 141 03/26/2018   K 4.0 03/26/2018   CL 110 03/26/2018   CO2 21 (L) 03/26/2018   BUN 8 03/26/2018   CREATININE 0.87 03/26/2018   GLUCOSE 102 (H) 03/26/2018    Discharge Medications:   Allergies as of 03/27/2018   No Known Allergies     Medication List    TAKE these medications   aspirin EC 325 MG tablet Take 1 tablet (325 mg total) by mouth 2 (two) times daily after a meal.   docusate sodium 100 MG capsule Commonly known as:  COLACE Take 1 capsule (100 mg total) by mouth 2 (two) times daily.   HYDROcodone-acetaminophen 7.5-325 MG tablet Commonly known as:  NORCO Take 1-2 tablets by mouth every 4 (four) hours as needed for severe pain (pain score 7-10).   ondansetron 4 MG tablet Commonly known as:  ZOFRAN Take 1 tablet (4 mg total) by mouth every 6 (six) hours as needed for nausea.   senna 8.6 MG Tabs tablet Commonly known as:  SENOKOT Take 1 tablet (8.6 mg total) by mouth 2 (two) times daily.   senna 8.6 MG Tabs tablet Commonly known as:  SENOKOT Take 2 tablets (17.2 mg total) by mouth at bedtime.            Durable Medical Equipment  (From  admission, onward)        Start     Ordered   03/26/18 1701  DME Walker rolling  Once    Question:  Patient needs a walker to treat with the following condition  Answer:  Closed displaced comminuted fracture of shaft of right tibia   03/26/18 1700   03/26/18 1701  DME 3 n 1  Once     03/26/18 1700      Diagnostic Studies: Dg Tibia/fibula Right  Result Date: 03/26/2018 CLINICAL DATA:  Recent fall, deformity, leg pain EXAM: RIGHT TIBIA AND FIBULA - 2 VIEW COMPARISON:  03/26/2018 FINDINGS: There is an acute minimally displaced spiral type fracture of the right distal tibia shaft. No malalignment at the ankle or knee joint. There is an associated proximal fibula minimally displaced fracture as well. IMPRESSION: Acute  minimally displaced right tibia spiral type fracture. Associated proximal fibula fracture Electronically Signed   By: Judie Petit.  Shick M.D.   On: 03/26/2018 07:55   Dg Ankle Complete Right  Result Date: 03/26/2018 CLINICAL DATA:  Recent fall, severe pain, deformity EXAM: RIGHT ANKLE - COMPLETE 3+ VIEW COMPARISON:  03/26/2018 FINDINGS: There is acute displaced spiral fracture of the right distal tibia above the ankle. Ankle alignment maintained. No other joint abnormality. Mild soft tissue swelling. IMPRESSION: Acute displaced right distal tibia spiral fracture. Electronically Signed   By: Judie Petit.  Shick M.D.   On: 03/26/2018 07:56   Dg Tibia/fibula Right Port  Result Date: 03/26/2018 CLINICAL DATA:  Postop check EXAM: PORTABLE RIGHT TIBIA AND FIBULA - 2 VIEW COMPARISON:  None. FINDINGS: Intramedullary rod appears appropriately positioned, traversing the tibia fracture site. Fixation screws appear appropriately positioned. Osseous alignment is anatomic. Slightly displaced fracture at the base of the fibular head. Expected postsurgical changes within the overlying soft tissues. IMPRESSION: Intramedullary rod, with associated fixation screws, appear intact and appropriately positioned within the RIGHT tibia. No evidence of surgical complicating feature. Electronically Signed   By: Bary Richard M.D.   On: 03/26/2018 16:47   Dg Tibia/fibula Right Port  Result Date: 03/26/2018 CLINICAL DATA:  Fracture. EXAM: PORTABLE RIGHT TIBIA AND FIBULA - 2 VIEW COMPARISON:  Multiple priors. FINDINGS: Intramedullary rod has been placed across a RIGHT tibial fracture. No adverse features. Proximal RIGHT fibular fracture, minimally displaced, unchanged. IMPRESSION: Satisfactory position and alignment. Electronically Signed   By: Elsie Stain M.D.   On: 03/26/2018 15:55   Dg C-arm 1-60 Min-no Report  Result Date: 03/26/2018 Fluoroscopy was utilized by the requesting physician.  No radiographic interpretation.    Disposition:      Follow-up Information    Swinteck, Arlys John, MD. Schedule an appointment as soon as possible for a visit in 2 weeks.   Specialty:  Orthopedic Surgery Why:  For suture removal Contact information: [address] [place] [address] [phone]            Signed: Iline Oven Swinteck 03/27/2018, 7:51 AM

## 2018-03-27 NOTE — Progress Notes (Signed)
Physical Therapy Treatment Patient Details Name: Sally Myers MRN: [id number] DOB: 08/09/1971 Today's Date: 03/27/2018    History of Present Illness s/p R ORIF tib/fib fx; PMHx: drug use(marijuana)    PT Comments    Pt progressing well; wanting to take a nap but agreeable to PT first;  tol  incr amb distance this pm; will need to practice stairs in am, may d/c tomorrow;  (Pt has 2 steps to enter home and flight to second level, only full bath is upstairs--pt has done scooting technique in past)  Follow Up Recommendations  No PT follow up     Equipment Recommendations  Rolling walker with 5" wheels    Recommendations for Other Services       Precautions / Restrictions Precautions Precautions: Fall Restrictions RLE Weight Bearing: Touchdown weight bearing    Mobility  Bed Mobility Overal bed mobility: Modified Independent Bed Mobility: Supine to Sit     Supine to sit: Supervision     General bed mobility comments: oob  Transfers Overall transfer level: Needs assistance Equipment used: Rolling walker (2 wheeled) Transfers: Sit to/from Stand Sit to Stand: Min guard         General transfer comment: for safety  Ambulation/Gait Ambulation/Gait assistance: Min guard Ambulation Distance (Feet): 40 Feet(8' more) Assistive device: Rolling walker (2 wheeled)       General Gait Details: cues for sequence,  and TDWB, slow but steady gait   Stairs             Wheelchair Mobility    Modified Rankin (Stroke Patients Only)       Balance Overall balance assessment: Needs assistance Sitting-balance support: Feet supported;No upper extremity supported Sitting balance-Leahy Scale: Good       Standing balance-Leahy Scale: Poor Standing balance comment: pt able to stand and brush teeth at sink with unilateral UE support, no LOB, maintaining  TDWB                            Cognition Arousal/Alertness: Awake/alert Behavior During Therapy: WFL  for tasks assessed/performed Overall Cognitive Status: Within Functional Limits for tasks assessed                                        Exercises      General Comments        Pertinent Vitals/Pain Pain Assessment: 0-10 Pain Score: 4  Pain Location: right ankle/leg/knee Pain Descriptors / Indicators: Aching;Throbbing Pain Intervention(s): Monitored during session;Premedicated before session;Ice applied;Repositioned    Home Living Family/patient expects to be discharged to:: Private residence Living Arrangements: Children             Additional Comments: 47 year old can do self care; home therapy team for him.  Pt does meds, meals, etc.    Prior Function Level of Independence: Independent          PT Goals (current goals can now be found in the care plan section) Acute Rehab PT Goals Patient Stated Goal: home, regain independence PT Goal Formulation: With patient Time For Goal Achievement: 04/03/18 Potential to Achieve Goals: Good Progress towards PT goals: Progressing toward goals    Frequency    7X/week      PT Plan Current plan remains appropriate    Co-evaluation              AM-PAC  PT "6 Clicks" Daily Activity  Outcome Measure  Difficulty turning over in bed (including adjusting bedclothes, sheets and blankets)?: A Little Difficulty moving from lying on back to sitting on the side of the bed? : A Little Difficulty sitting down on and standing up from a chair with arms (e.g., wheelchair, bedside commode, etc,.)?: A Little Help needed moving to and from a bed to chair (including a wheelchair)?: A Little Help needed walking in hospital room?: A Little Help needed climbing 3-5 steps with a railing? : A Lot 6 Click Score: 17    End of Session Equipment Utilized During Treatment: Gait belt Activity Tolerance: Patient tolerated treatment well Patient left: with call bell/phone within reach;in bed   PT Visit Diagnosis:  Unsteadiness on feet (R26.81);Difficulty in walking, not elsewhere classified (R26.2)     Time: 4782-9562 PT Time Calculation (min) (ACUTE ONLY): 31 min  Charges:  $Gait Training: 23-37 mins                    G CodesDrucilla Chalet, PT Pager: [phone] 03/27/2018    Drucilla Chalet 03/27/2018, 2:49 PM

## 2018-03-28 MED ORDER — OXYCODONE HCL 5 MG PO TABS
5.0000 mg | ORAL_TABLET | ORAL | Status: DC | PRN
  Administered 2018-03-28 – 2018-03-29 (×7): 10 mg via ORAL
  Filled 2018-03-28 (×7): qty 2

## 2018-03-28 MED ORDER — FLUCONAZOLE 150 MG PO TABS
150.0000 mg | ORAL_TABLET | Freq: Once | ORAL | Status: AC
  Administered 2018-03-28: 150 mg via ORAL
  Filled 2018-03-28: qty 1

## 2018-03-28 NOTE — Plan of Care (Signed)
  Problem: Pain Managment: Goal: General experience of comfort will improve Outcome: Progressing   Problem: Activity: Goal: Risk for activity intolerance will decrease Outcome: Progressing   

## 2018-03-28 NOTE — Progress Notes (Addendum)
Subjective:  2 Days Post-Op Procedure(s) (LRB): INTRAMEDULLARY (IM) NAIL TIBIAL (Right) Patient reports pain as moderate.  Reports pain right knee and lower leg. No numbness or tingling. Block has worn off at this point. Some nausea. Does not feel pain is controlled enough for D/C yet.  Objective: Vital signs in last 24 hours: Temp:  [97.8 F (36.6 C)-98.8 F (37.1 C)] 98.1 F (36.7 C) (05/11 0426) Pulse Rate:  [72-85] 85 (05/11 0426) Resp:  [16-18] 16 (05/11 0426) BP: (108-121)/(71-88) 120/75 (05/11 0426) SpO2:  [99 %-100 %] 99 % (05/11 0426)  Intake/Output from previous day: 05/10 0701 - 05/11 0700 In: 1512.5 [P.O.:425; I.V.:1087.5] Out: 1300 [Urine:1300] Intake/Output this shift: No intake/output data recorded.  Recent Labs    03/26/18 0622  HGB 11.7*   Recent Labs    03/26/18 0622  WBC 5.7  RBC 3.57*  HCT 35.1*  PLT 292   Recent Labs    03/26/18 0622  NA 141  K 4.0  CL 110  CO2 21*  BUN 8  CREATININE 0.87  GLUCOSE 102*  CALCIUM 8.6*   No results for input(s): LABPT, INR in the last 72 hours.  Neurologically intact ABD soft Neurovascular intact Sensation intact distally Intact pulses distally Dorsiflexion/Plantar flexion intact Incision: dressing C/D/I No cellulitis present Compartment soft no sign of DVT  Assessment/Plan: 2 Days Post-Op Procedure(s) (LRB): INTRAMEDULLARY (IM) NAIL TIBIAL (Right) Advance diet Up with therapy D/C IV fluids Pain control Nausea meds prn Plan D/C tomorrow when pain better controlled TDWB RLE  Horton Ellithorpe M. 03/28/2018, 9:58 AM

## 2018-03-28 NOTE — Progress Notes (Signed)
Physical Therapy Treatment Patient Details Name: Sally Myers MRN: [id number] DOB: 31-Jan-1971 Today's Date: 03/28/2018    History of Present Illness s/p R ORIF tib/fib fx; PMHx: drug use    PT Comments    *the patient has been on crutches in past. States that she will go up steps inside on buttocks. Will practice steps to enter home next visit.  Follow Up Recommendations  No PT follow up     Equipment Recommendations  Crutches , can borrow a Rolling walker with 5" wheels    Recommendations for Other Services       Precautions / Restrictions Precautions Precautions: Fall Restrictions RLE Weight Bearing: Touchdown weight bearing    Mobility  Bed Mobility Overal bed mobility: Modified Independent                Transfers   Equipment used: Crutches Transfers: Sit to/from Stand Sit to Stand: Supervision         General transfer comment: does a great job maintaining TDWB, cues for crutch position for sit/standing  Ambulation/Gait Ambulation/Gait assistance: Min Environmental consultant (Feet): 60 Feet Assistive device: Crutches Gait Pattern/deviations: Step-to pattern     General Gait Details: cues for sequence,  and TDWB, slow but steady gait   Stairs             Wheelchair Mobility    Modified Rankin (Stroke Patients Only)       Balance                                            Cognition Arousal/Alertness: Awake/alert                                            Exercises General Exercises - Lower Extremity Ankle Circles/Pumps: AROM;Right Quad Sets: AROM;Right    General Comments        Pertinent Vitals/Pain Pain Score: 4  Pain Location: right ankle/leg/knee Pain Descriptors / Indicators: Aching;Throbbing Pain Intervention(s): Monitored during session;Premedicated before session    Home Living                      Prior Function            PT Goals (current goals can now  be found in the care plan section) Progress towards PT goals: Progressing toward goals    Frequency    7X/week      PT Plan Current plan remains appropriate    Co-evaluation              AM-PAC PT "6 Clicks" Daily Activity  Outcome Measure  Difficulty turning over in bed (including adjusting bedclothes, sheets and blankets)?: None Difficulty moving from lying on back to sitting on the side of the bed? : A Little Difficulty sitting down on and standing up from a chair with arms (e.g., wheelchair, bedside commode, etc,.)?: A Little Help needed moving to and from a bed to chair (including a wheelchair)?: A Little Help needed walking in hospital room?: A Little Help needed climbing 3-5 steps with a railing? : A Lot 6 Click Score: 18    End of Session Equipment Utilized During Treatment: Gait belt Activity Tolerance: Patient tolerated treatment well Patient left: with call bell/phone within reach;in bed  Nurse Communication: Mobility status PT Visit Diagnosis: Unsteadiness on feet (R26.81);Difficulty in walking, not elsewhere classified (R26.2)     Time: 1610-9604 PT Time Calculation (min) (ACUTE ONLY): 13 min  Charges:  $Gait Training: 8-22 mins                    G CodesBlanchard Kelch PT [phone]   Rada Hay 03/28/2018, 5:01 PM

## 2018-03-28 NOTE — Progress Notes (Signed)
Occupational Therapy Treatment Patient Details Name: Sally Myers MRN: [id number] DOB: 11/24/1970 Today's Date: 03/28/2018    History of present illness s/p R ORIF tib/fib fx; PMHx: drug use   OT comments  Patient practiced toilet transfer, toileting, AE use, and was educated on tub transfer with tub bench this session. She is moving well and does a great job maintaining TDWB RLE.    Follow Up Recommendations  Supervision - Intermittent    Equipment Recommendations  3 in 1 bedside commode;Tub/shower bench    Recommendations for Other Services      Precautions / Restrictions Precautions Precautions: Fall Restrictions Weight Bearing Restrictions: Yes RLE Weight Bearing: Touchdown weight bearing       Mobility Bed Mobility Overal bed mobility: Modified Independent Bed Mobility: Supine to Sit              Transfers Overall transfer level: Needs assistance Equipment used: Rolling walker (2 wheeled) Transfers: Sit to/from Stand Sit to Stand: Supervision         General transfer comment: does a great job maintaining TDWB    Balance                                           ADL either performed or assessed with clinical judgement   ADL Overall ADL's : Needs assistance/impaired Eating/Feeding: Independent   Grooming: Set up;Sitting   Upper Body Bathing: Set up;Sitting   Lower Body Bathing: Supervison/ safety;Sit to/from stand   Upper Body Dressing : Set up;Sitting   Lower Body Dressing: Supervision/safety;Sit to/from stand   Toilet Transfer: Supervision/safety;BSC;RW   Toileting- Architect and Hygiene: Independent;Sit to/from stand;Sitting/lateral lean     Tub/Shower Transfer Details (indicate cue type and reason): demonstrated tub bench transfer to patient Functional mobility during ADLs: Supervision/safety;Rolling walker General ADL Comments: Patient reported she did a sponge bath EOB with nursing and they did not have  to help her. She was dressed in clothing from home. Patient toileted with supervision/near independence using RW and BSC. Reviewed use of long sponge and reacher and patient practiced with both. Also brought tub bench in and demosntrated use of it. Will order 3 in 1 and tub bench for discharge.     Vision       Perception     Praxis      Cognition Arousal/Alertness: Awake/alert Behavior During Therapy: WFL for tasks assessed/performed Overall Cognitive Status: Within Functional Limits for tasks assessed                                          Exercises     Shoulder Instructions       General Comments      Pertinent Vitals/ Pain       Pain Assessment: 0-10 Pain Score: 4  Pain Location: right ankle/leg/knee Pain Descriptors / Indicators: Aching;Throbbing Pain Intervention(s): Limited activity within patient's tolerance;Monitored during session;Premedicated before session  Home Living                                          Prior Functioning/Environment              Frequency  Min 2X/week  Progress Toward Goals  OT Goals(current goals can now be found in the care plan section)  Progress towards OT goals: Progressing toward goals     Plan Discharge plan remains appropriate    Co-evaluation                 AM-PAC PT "6 Clicks" Daily Activity     Outcome Measure   Help from another person eating meals?: None Help from another person taking care of personal grooming?: None Help from another person toileting, which includes using toliet, bedpan, or urinal?: A Little Help from another person bathing (including washing, rinsing, drying)?: A Little Help from another person to put on and taking off regular upper body clothing?: None Help from another person to put on and taking off regular lower body clothing?: None 6 Click Score: 22    End of Session Equipment Utilized During Treatment: Rolling walker  OT  Visit Diagnosis: Pain Pain - Right/Left: Right Pain - part of body: Ankle and joints of foot   Activity Tolerance Patient tolerated treatment well   Patient Left in bed;with call bell/phone within reach   Nurse Communication Mobility status        Time: 1206-1228 OT Time Calculation (min): 22 min  Charges: OT General Charges $OT Visit: 1 Visit OT Treatments $Self Care/Home Management : 8-22 mins     Valentino Saavedra A Mayrin Schmuck 03/28/2018, 1:35 PM

## 2018-03-29 MED ORDER — OXYCODONE HCL 5 MG PO TABS: 5.0000 mg | ORAL_TABLET | ORAL | 0 refills | Status: AC | PRN

## 2018-03-29 MED ORDER — ZOLPIDEM TARTRATE 5 MG PO TABS
5.0000 mg | ORAL_TABLET | Freq: Every evening | ORAL | Status: DC | PRN
  Administered 2018-03-29: 5 mg via ORAL
  Filled 2018-03-29: qty 1

## 2018-03-29 NOTE — Progress Notes (Signed)
Patient c/o not having any restful sleep, expresses that the pain increases at night. Information relayed to on-call J Bisell-PA given verbal orders for Ambien  QHS PRN.

## 2018-03-29 NOTE — Progress Notes (Signed)
Subjective: 3 Days Post-Op Procedure(s) (LRB): INTRAMEDULLARY (IM) NAIL TIBIAL (Right) Patient reports pain as moderate.  Tolerating Po's. No SOb or CP.  Objective: Vital signs in last 24 hours: Temp:  [97.9 F (36.6 C)-98.6 F (37 C)] 98.6 F (37 C) (05/12 0623) Pulse Rate:  [89-92] 92 (05/12 0623) Resp:  [16-18] 16 (05/12 0623) BP: (105-120)/(63-82) 105/63 (05/12 0623) SpO2:  [97 %-100 %] 97 % (05/12 0623)  Intake/Output from previous day: 05/11 0701 - 05/12 0700 In: 480 [P.O.:480] Out: -  Intake/Output this shift: No intake/output data recorded.  No results for input(s): HGB in the last 72 hours. No results for input(s): WBC, RBC, HCT, PLT in the last 72 hours. No results for input(s): NA, K, CL, CO2, BUN, CREATININE, GLUCOSE, CALCIUM in the last 72 hours. No results for input(s): LABPT, INR in the last 72 hours.  Well nourished. Alert and oriented x3. RRR, Lungs clear, BS x4. Abdomen soft and non tender. Right Calf soft and non tender. Right lower leg dressing C/D/I. No DVT signs. Compartment soft. No signs of infection.  Right LE grossly neurovascular intact.  Anticipated LOS equal to or greater than 2 midnights due to - Age 48 and older with one or more of the following:  - Obesity  - Expected need for hospital services (PT, OT, Nursing) required for safe  discharge  - Anticipated need for postoperative skilled nursing care or inpatient rehab  - Active co-morbidities: None OR   - Unanticipated findings during/Post Surgery: None  - Patient is a high risk of re-admission due to: None   Assessment/Plan: 3 Days Post-Op Procedure(s) (LRB): INTRAMEDULLARY (IM) NAIL TIBIAL (Right) Advance diet Up with therapy Discharge home with home health  Pain management    Sally Myers L 03/29/2018, 8:49 AM

## 2018-03-29 NOTE — Progress Notes (Signed)
Physical Therapy Treatment Patient Details Name: Sally Myers MRN: [id number] DOB: [birth year] Today's Date: 03/29/2018    History of Present Illness s/p R ORIF tib/fib fx; PMHx: drug use    PT Comments    Pt mobilizing well with crutches and had no unsteadiness or LOB.  Pt performed a couple steps with crutches as well.  Pt ready for d/c home today.   Follow Up Recommendations  No PT follow up     Equipment Recommendations  Rolling walker with 5" wheels    Recommendations for Other Services       Precautions / Restrictions Precautions Precautions: Fall Restrictions Weight Bearing Restrictions: Yes RLE Weight Bearing: Touchdown weight bearing    Mobility  Bed Mobility Overal bed mobility: Modified Independent                Transfers Overall transfer level: Needs assistance Equipment used: Crutches Transfers: Sit to/from Stand Sit to Stand: Supervision         General transfer comment: does a great job maintaining TDWB, cues for crutch position for sit/standing  Ambulation/Gait Ambulation/Gait assistance: Min guard Ambulation Distance (Feet): 80 Feet Assistive device: Crutches Gait Pattern/deviations: Step-to pattern     General Gait Details: cues for sequence, slow but steady gait, pt mostly maintained NWB and swing through gait   Stairs Stairs: Yes   Stair Management: Step to pattern;Forwards;With crutches Number of Stairs: 2 General stair comments: pt able to recall safe technique from previous use of crutches, steady and no LOB observed   Wheelchair Mobility    Modified Rankin (Stroke Patients Only)       Balance                                            Cognition Arousal/Alertness: Awake/alert Behavior During Therapy: WFL for tasks assessed/performed Overall Cognitive Status: Within Functional Limits for tasks assessed                                        Exercises      General Comments         Pertinent Vitals/Pain Pain Assessment: 0-10 Pain Score: 3  Pain Location: right ankle/leg/knee Pain Descriptors / Indicators: Aching;Throbbing Pain Intervention(s): Ice applied;Monitored during session;Premedicated before session    Home Living                      Prior Function            PT Goals (current goals can now be found in the care plan section) Progress towards PT goals: Progressing toward goals    Frequency    7X/week      PT Plan Current plan remains appropriate    Co-evaluation              AM-PAC PT "6 Clicks" Daily Activity  Outcome Measure  Difficulty turning over in bed (including adjusting bedclothes, sheets and blankets)?: None Difficulty moving from lying on back to sitting on the side of the bed? : A Little Difficulty sitting down on and standing up from a chair with arms (e.g., wheelchair, bedside commode, etc,.)?: A Little Help needed moving to and from a bed to chair (including a wheelchair)?: A Little Help needed walking in hospital room?: A Little Help  needed climbing 3-5 steps with a railing? : A Little 6 Click Score: 19    End of Session   Activity Tolerance: Patient tolerated treatment well Patient left: with call bell/phone within reach;in bed Nurse Communication: Mobility status PT Visit Diagnosis: Difficulty in walking, not elsewhere classified (R26.2)     Time: 1610-9604 PT Time Calculation (min) (ACUTE ONLY): 15 min  Charges:  $Gait Training: 8-22 mins                    G Codes:       Zenovia Jarred, PT, DPT 03/29/2018 Pager: [phone]  Maida Sale E 03/29/2018, 12:26 PM

## 2018-03-29 NOTE — Progress Notes (Signed)
Pt to d/c home. AVS reviewed and "My Chart" discussed with pt. Pt capable of verbalizing medications, signs and symptoms of infection, and follow-up appointments. Remains hemodynamically stable. Patient is anxious about pain. She has had ongoing pain with medication given every available opportunity. Paged on-call PA, Arsenio Loader, and informed him of patient's request for Robaxin to be sent in to her pharmacy. Bryson aware. Educated pt to return to ER in the case of SOB, dizziness, or chest pain.

## 2018-03-30 NOTE — Anesthesia Postprocedure Evaluation (Signed)
Anesthesia Post Note  Patient: Seraphim Affinito  Procedure(s) Performed: INTRAMEDULLARY (IM) NAIL TIBIAL (Right Leg Lower)     Patient location during evaluation: PACU Anesthesia Type: General Level of consciousness: awake and alert Pain management: pain level controlled Vital Signs Assessment: post-procedure vital signs reviewed and stable Respiratory status: spontaneous breathing, nonlabored ventilation, respiratory function stable and patient connected to nasal cannula oxygen Cardiovascular status: blood pressure returned to baseline and stable Postop Assessment: no apparent nausea or vomiting Anesthetic complications: no    Last Vitals:  Vitals:   03/28/18 2210 03/29/18 0623  BP: 109/75 105/63  Pulse: 89 92  Resp: 16 16  Temp: 36.6 C 37 C  SpO2: 100% 97%    Last Pain:  Vitals:   03/29/18 1322  TempSrc:   PainSc: 7    Pain Goal: Patients Stated Pain Goal: 3 (03/29/18 1322)               Aquila Delaughter S

## 2018-07-18 ENCOUNTER — Other Ambulatory Visit: Payer: Self-pay

## 2018-07-18 ENCOUNTER — Emergency Department (HOSPITAL_COMMUNITY): Payer: Medicaid Other

## 2018-07-18 ENCOUNTER — Emergency Department (HOSPITAL_COMMUNITY)
Admission: EM | Admit: 2018-07-18 | Discharge: 2018-07-18 | Disposition: A | Discharge status: Home / Self Care | Payer: Medicaid Other | Attending: Emergency Medicine | Admitting: Emergency Medicine

## 2018-07-18 ENCOUNTER — Encounter (HOSPITAL_COMMUNITY): Payer: Self-pay

## 2018-07-18 DIAGNOSIS — Z202 Contact with and (suspected) exposure to infections with a predominantly sexual mode of transmission: Secondary | ICD-10-CM

## 2018-07-18 DIAGNOSIS — Z79899 Other long term (current) drug therapy: Secondary | ICD-10-CM | POA: Insufficient documentation

## 2018-07-18 DIAGNOSIS — N739 Female pelvic inflammatory disease, unspecified: Secondary | ICD-10-CM | POA: Diagnosis not present

## 2018-07-18 DIAGNOSIS — N949 Unspecified condition associated with female genital organs and menstrual cycle: Secondary | ICD-10-CM | POA: Diagnosis not present

## 2018-07-18 DIAGNOSIS — Z7982 Long term (current) use of aspirin: Secondary | ICD-10-CM | POA: Insufficient documentation

## 2018-07-18 LAB — WET PREP, GENITAL
SPERM: NONE SEEN
Trich, Wet Prep: NONE SEEN
YEAST WET PREP: NONE SEEN

## 2018-07-18 LAB — URINALYSIS, ROUTINE W REFLEX MICROSCOPIC
Bilirubin Urine: NEGATIVE
GLUCOSE, UA: NEGATIVE mg/dL
Hgb urine dipstick: NEGATIVE
Ketones, ur: 5 mg/dL — AB
LEUKOCYTES UA: NEGATIVE
Nitrite: NEGATIVE
PROTEIN: NEGATIVE mg/dL
Specific Gravity, Urine: 1.025 (ref 1.005–1.030)
pH: 5 (ref 5.0–8.0)

## 2018-07-18 LAB — PREGNANCY, URINE: Preg Test, Ur: NEGATIVE

## 2018-07-18 MED ORDER — DOXYCYCLINE HYCLATE 100 MG PO TABS
100.0000 mg | ORAL_TABLET | Freq: Once | ORAL | Status: AC
  Administered 2018-07-18: 100 mg via ORAL
  Filled 2018-07-18: qty 1

## 2018-07-18 MED ORDER — IOPAMIDOL (ISOVUE-300) INJECTION 61%
INTRAVENOUS | Status: AC
  Filled 2018-07-18: qty 100

## 2018-07-18 MED ORDER — DOXYCYCLINE HYCLATE 100 MG PO CAPS: 100.0000 mg | ORAL_CAPSULE | Freq: Two times a day (BID) | ORAL | 0 refills | Status: AC

## 2018-07-18 MED ORDER — IOPAMIDOL (ISOVUE-300) INJECTION 61%
100.0000 mL | Freq: Once | INTRAVENOUS | Status: AC | PRN
  Administered 2018-07-18: 100 mL via INTRAVENOUS

## 2018-07-18 MED ORDER — STERILE WATER FOR INJECTION IJ SOLN
INTRAMUSCULAR | Status: AC
  Administered 2018-07-18: 0.9 mL
  Filled 2018-07-18: qty 10

## 2018-07-18 MED ORDER — CEFTRIAXONE SODIUM 250 MG IJ SOLR
250.0000 mg | Freq: Once | INTRAMUSCULAR | Status: AC
  Administered 2018-07-18: 250 mg via INTRAMUSCULAR
  Filled 2018-07-18: qty 250

## 2018-07-18 NOTE — ED Triage Notes (Signed)
Pt states that 2 weeks ago, her partner told her he had chlamydia . Pt was tested and treated, but had intercourse before 7 days are up. 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 Pt wants to just be retested for everything.

## 2018-07-18 NOTE — ED Provider Notes (Signed)
[facility]-EMERGENCY DEPT Provider Note   CSN: [id number] Arrival date & time: 07/18/18  1429     History   Chief Complaint Chief Complaint  Patient presents with  . Exposure to STD    HPI Sally Myers is a 47 y.o. female who presents to ED for evaluation of STD exposure.  States that 10 days ago, her female partner told her that he tested positive for chlamydia.  She was tested about 7 days ago and was given a shot and 4 pills to help treat her infection.  At that time she had heavy vaginal bleeding at the time.  States that she did not wait the 7 days to have sexual intercourse again. She was told that she tested positive for chlamydia. She reports having painful sexual intercourse last night. No abnormal bleeding noted. She does note some vaginal discharge, dysuria. She is not on any form of birth control. Denies any fevers, changes to bowel movements, vomiting.  HPI  History reviewed. No pertinent past medical history.  Patient Active Problem List   Diagnosis Date Noted  . Closed displaced comminuted fracture of shaft of right tibia 03/26/2018    Past Surgical History:  Procedure Laterality Date  . TIBIA IM NAIL INSERTION Right 03/26/2018   Procedure: INTRAMEDULLARY (IM) NAIL TIBIAL;  Surgeon: Samson Frederic, MD;  Location: [facility];  Service: Orthopedics;  Laterality: Right;     OB History   None      Home Medications    Prior to Admission medications   Medication Sig Start Date End Date Taking? Authorizing Provider  acetaminophen (TYLENOL) 325 MG tablet Take 650 mg by mouth every 6 (six) hours as needed for headache.   Yes [provider]  doxylamine, Sleep, (UNISOM) 25 MG tablet Take 50 mg by mouth at bedtime as needed for sleep.   Yes [provider]  fluconazole (DIFLUCAN) 200 MG tablet Take 200 mg by mouth every Thursday.  07/03/18  Yes [provider]  naproxen sodium (ALEVE) 220 MG tablet Take 440 mg by mouth 2 (two)  times daily as needed (pain).   Yes [provider]  aspirin EC 325 MG tablet Take 1 tablet (325 mg total) by mouth 2 (two) times daily after a meal. Patient not taking: Reported on 07/18/2018 03/27/18   Samson Frederic, MD  docusate sodium (COLACE) 100 MG capsule Take 1 capsule (100 mg total) by mouth 2 (two) times daily. Patient not taking: Reported on 07/18/2018 03/27/18   Samson Frederic, MD  doxycycline (VIBRAMYCIN) 100 MG capsule Take 1 capsule (100 mg total) by mouth 2 (two) times daily for 14 days. 07/18/18 08/01/18  Barnes Florek, PA-C  ondansetron (ZOFRAN) 4 MG tablet Take 1 tablet (4 mg total) by mouth every 6 (six) hours as needed for nausea. Patient not taking: Reported on 07/18/2018 03/27/18   Samson Frederic, MD  senna (SENOKOT) 8.6 MG TABS tablet Take 1 tablet (8.6 mg total) by mouth 2 (two) times daily. Patient not taking: Reported on 07/18/2018 03/27/18   Samson Frederic, MD  senna (SENOKOT) 8.6 MG TABS tablet Take 2 tablets (17.2 mg total) by mouth at bedtime. Patient not taking: Reported on 07/18/2018 03/27/18   Samson Frederic, MD    Family History Family History  Problem Relation Age of Onset  . Breast cancer Neg Hx     Social History Social History   Tobacco Use  . Smoking status: Never Smoker  . Smokeless tobacco: Never Used  Substance Use  Topics  . Alcohol use: Yes    Comment: "occasional except for tonight"  . Drug use: Yes    Types: Marijuana     Allergies   Patient has no known allergies.   Review of Systems Review of Systems  Constitutional: Negative for appetite change, chills and fever.  HENT: Negative for ear pain, rhinorrhea, sneezing and sore throat.   Eyes: Negative for photophobia and visual disturbance.  Respiratory: Negative for cough, chest tightness, shortness of breath and wheezing.   Cardiovascular: Negative for chest pain and palpitations.  Gastrointestinal: Negative for abdominal pain, blood in stool, constipation, diarrhea, nausea  and vomiting.  Genitourinary: Positive for dysuria, pelvic pain and vaginal discharge. Negative for hematuria, urgency and vaginal bleeding.  Musculoskeletal: Negative for myalgias.  Skin: Negative for rash.  Neurological: Negative for dizziness, weakness and light-headedness.     Physical Exam Updated Vital Signs BP (!) 104/58   Pulse 67   Temp 98.4 F (36.9 C) (Oral)   Resp 18   Ht 5\' 3"  (1.6 m)   Wt 55.8 kg   SpO2 100%   BMI 21.79 kg/m   Physical Exam  Constitutional: She appears well-developed and well-nourished. No distress.  HENT:  Head: Normocephalic and atraumatic.  Nose: Nose normal.  Eyes: Conjunctivae and EOM are normal. Left eye exhibits no discharge. No scleral icterus.  Neck: Normal range of motion. Neck supple.  Cardiovascular: Normal rate, regular rhythm, normal heart sounds and intact distal pulses. Exam reveals no gallop and no friction rub.  No murmur heard. Pulmonary/Chest: Effort normal and breath sounds normal. No respiratory distress.  Abdominal: Soft. Bowel sounds are normal. She exhibits no distension. There is no tenderness. There is no guarding.  Genitourinary: Uterus is tender. Cervix exhibits motion tenderness. Right adnexum displays tenderness. Left adnexum displays tenderness.  Genitourinary Comments: Pelvic exam: normal external genitalia without evidence of trauma. VULVA: normal appearing vulva with no masses, tenderness or lesion. VAGINA: normal appearing vagina with normal color and yellow/white discharge, no lesions. CERVIX: normal appearing cervix without lesions, cervical motion tenderness present, cervical os closed with out purulent discharge; Wet prep and DNA probe for chlamydia and GC obtained.   ADNEXA: normal adnexa in size, bilateral adnexal tenderness R > L and no masses UTERUS: uterus is normal size, shape, consistency. Tender with bimanual exam.  Musculoskeletal: Normal range of motion. She exhibits no edema.  Neurological: She  is alert. She exhibits normal muscle tone. Coordination normal.  Skin: Skin is warm and dry. No rash noted.  Psychiatric: She has a normal mood and affect.  Nursing note and vitals reviewed.    ED Treatments / Results  Labs (all labs ordered are listed, but only abnormal results are displayed) Labs Reviewed  WET PREP, GENITAL - Abnormal; Notable for the following components:      Result Value   Clue Cells Wet Prep HPF POC PRESENT (*)    WBC, Wet Prep HPF POC MANY (*)    All other components within normal limits  URINALYSIS, ROUTINE W REFLEX MICROSCOPIC - Abnormal; Notable for the following components:   Ketones, ur 5 (*)    All other components within normal limits  PREGNANCY, URINE  HIV ANTIBODY (ROUTINE TESTING)  RPR  GC/CHLAMYDIA PROBE AMP ([place]) NOT AT [facility]    EKG None  Radiology Ct Abdomen Pelvis W Contrast  Result Date: 07/18/2018 CLINICAL DATA:  47 year old female with possible mass seen in the right adnexa on ultrasound. EXAM: CT ABDOMEN AND PELVIS WITH  CONTRAST TECHNIQUE: Multidetector CT imaging of the abdomen and pelvis was performed using the standard protocol following bolus administration of intravenous contrast. CONTRAST:  100mL ISOVUE-300 IOPAMIDOL (ISOVUE-300) INJECTION 61% COMPARISON:  Pelvic ultrasound dated 07/18/2018 FINDINGS: Lower chest: The visualized lung bases are clear. No intra-abdominal free air or free fluid. Hepatobiliary: Subcentimeter hypodense fluid is in the inferior right lobe of the liver (series 2, image 28) is not well characterized. The liver is otherwise unremarkable. No intrahepatic biliary ductal dilatation. There are multiple stones in the gallbladder. No pericholecystic fluid or evidence of acute cholecystitis by CT. Pancreas: Unremarkable. No pancreatic ductal dilatation or surrounding inflammatory changes. Spleen: Normal in size without focal abnormality. Adrenals/Urinary Tract: The adrenal glands are unremarkable. There is no  hydronephrosis on either side. There is symmetric enhancement and excretion of contrast by both kidneys. The visualized ureters and urinary bladder appear unremarkable. Stomach/Bowel: There is scattered colonic diverticula without active inflammatory changes. There is no bowel obstruction or active inflammation. The appendix is normal as visualized. Vascular/Lymphatic: The abdominal aorta and IVC are unremarkable. The SMV, splenic vein, and main portal vein are patent. No portal venous gas. There is no adenopathy. Reproductive: The uterus is anteverted and grossly unremarkable. The left is unremarkable as visualized. There is asymmetric prominence of the right ovary. There is an ill-defined heterogeneous structure measuring 1.8 x 2.1 cm abutting the superior aspect of the right ovary (coronal series 5, image 41). This may represent a complex cyst or part of the ovary or correspond to the mass seen on the earlier ultrasound. No additional adnexal masses identified on CT. Evaluation of the ovaries and adnexa are limited on CT. MRI may provide better characterisation of the described right adnexal mass on the earlier ultrasound. Other: A small fat containing umbilical hernia. Musculoskeletal: No acute or significant osseous findings. IMPRESSION: 1. Asymmetric prominence of the right ovary with an ill-defined heterogeneous area more superiorly. Evaluation of the ovaries are limited on CT. MRI may provide better characterization of the lesion seen on ultrasound. 2. Colonic diverticulosis. No bowel obstruction or active inflammation. Normal appendix. 3. Cholelithiasis. Electronically Signed   By: Elgie CollardArash  Radparvar M.D.   On: 07/18/2018 21:22   [place] Pelvic Complete With Transvaginal  Result Date: 07/18/2018 CLINICAL DATA:  Cervical motion tenderness.  SCD.  C-section.  G5P4 EXAM: TRANSABDOMINAL AND TRANSVAGINAL ULTRASOUND OF PELVIS TECHNIQUE: Both transabdominal and transvaginal ultrasound examinations of the pelvis were  performed. Transabdominal technique was performed for global imaging of the pelvis including uterus, ovaries, adnexal regions, and pelvic cul-de-sac. It was necessary to proceed with endovaginal exam following the transabdominal exam to visualize the ovaries. COMPARISON:  None FINDINGS: Uterus Measurements: Normal in size and echogenicity measuring 8.3 x 4.1 x 4.3. No fibroids or other mass visualized. Endometrium Thickness: Normal thickness at 8 mm for premenopausal female. Trace amount of fluid in the endometrial canal. Right ovary Measurements: Normal in size at 2.3 x 1.3 x 2.4 cm. Normal small follicles. There is a hypoechoic mass adjacent to the RIGHT adnexa measuring 2.7 x 2.3 by 1.7 cm with some low-level internal blood flow. Mass is rounded. Left ovary Measurements: Normal in size at 2.3 x 1.3 x 2.5 cm. Normal small follicles. Normal appearance/no adnexal mass. Other findings No abnormal free fluid. IMPRESSION: 1. Normal uterus. 2. Normal ovaries. 3. Ovoid mass adjacent to the RIGHT ovary with mild internal blood flow. Unclear origin of this mass. Mass could originate from the fallopian tube, appendix, or other para ovarian structure. Recommend  CT with contrast for further evaluation. Electronically Signed   By: Genevive Bi M.D.   On: 07/18/2018 17:56    Procedures Procedures (including critical care time)  Medications Ordered in ED Medications  iopamidol (ISOVUE-300) 61 % injection (has no administration in time range)  cefTRIAXone (ROCEPHIN) injection 250 mg (has no administration in time range)  doxycycline (VIBRA-TABS) tablet 100 mg (has no administration in time range)  sterile water (preservative free) injection (has no administration in time range)  iopamidol (ISOVUE-300) 61 % injection 100 mL (100 mLs Intravenous Contrast Given 07/18/18 2035)     Initial Impression / Assessment and Plan / ED Course  I have reviewed the triage vital signs and the nursing notes.  Pertinent labs &  imaging results that were available during my care of the patient were reviewed by me and considered in my medical decision making (see chart for details).     47 year old female presents to ED for evaluation of STD exposure.  She states that her female partner told her that he tested positive for chlamydia.  She was tested and treated about 7 days ago but did not wait the appropriate amount of time to have sexual intercourse again.  She reports having ongoing vaginal discharge, dysuria and pain with sexual intercourse last night.  She is not on any form of birth control.  She is on Flagyl and MetroGel for recurrent BV.  On physical exam there is bilateral adnexal tenderness, cervical motion tenderness and small amount of yellow vaginal discharge noted.  Otherwise no abdominal tenderness to palpation.  Wet prep positive for clue cells and WBC.  Urinalysis negative for UTI.  Urine pregnancy negative.  Pelvic ultrasound shows possible mass adjacent to the right ovary with an unknown origin.  CT of the abdomen pelvis showed redemonstration of the mass, normal appendix.  They recommended MRI.  I do not believe that this needs to be done emergently.  We will treat her for possible PID and advised her to follow-up with PCP for further evaluation.  Advised her to follow-up with remaining lab work with and is available. Strict return precautions given.  Portions of this note were generated with Scientist, clinical (histocompatibility and immunogenetics). Dictation errors may occur despite best attempts at proofreading.   Final Clinical Impressions(s) / ED Diagnoses   Final diagnoses:  CMT (cervical motion tenderness)  STD exposure  Pelvic inflammatory disease (PID)    ED Discharge Orders         Ordered    doxycycline (VIBRAMYCIN) 100 MG capsule  2 times daily     07/18/18 2201           Dietrich Pates, PA-C 07/18/18 2204    Maia Plan, MD 07/19/18 1112

## 2018-07-18 NOTE — Discharge Instructions (Addendum)
We will contact you with the results of the remaining lab work when it is available. Return to ED for worsening symptoms, abnormal vaginal bleeding, severe abdominal pain, vomiting or coughing up blood.

## 2018-07-19 LAB — HIV ANTIBODY (ROUTINE TESTING W REFLEX): HIV SCREEN 4TH GENERATION: NONREACTIVE

## 2018-07-19 LAB — RPR: RPR: NONREACTIVE

## 2018-07-23 LAB — GC/CHLAMYDIA PROBE AMP (~~LOC~~) NOT AT ARMC
Chlamydia: NEGATIVE
Neisseria Gonorrhea: NEGATIVE

## 2018-09-09 ENCOUNTER — Encounter: Payer: Self-pay | Admitting: Family Medicine

## 2018-09-09 ENCOUNTER — Encounter: Payer: Medicaid Other | Admitting: Family Medicine

## 2018-09-09 NOTE — Progress Notes (Signed)
Patient did not keep appointment today. She may call to reschedule.  

## 2019-07-08 ENCOUNTER — Other Ambulatory Visit: Payer: Self-pay | Admitting: Family Medicine

## 2019-07-08 DIAGNOSIS — Z1231 Encounter for screening mammogram for malignant neoplasm of breast: Secondary | ICD-10-CM

## 2019-08-20 ENCOUNTER — Ambulatory Visit: Payer: Medicaid Other

## 2019-10-01 IMAGING — CR DG ANKLE COMPLETE 3+V*R*
3 series · 3 of 3 positions shown · non-contrast
Comparison: 03/26/2018

CLINICAL DATA: Recent fall, severe pain, deformity

EXAM:
RIGHT ANKLE - COMPLETE 3+ VIEW

[x ankle lat right]
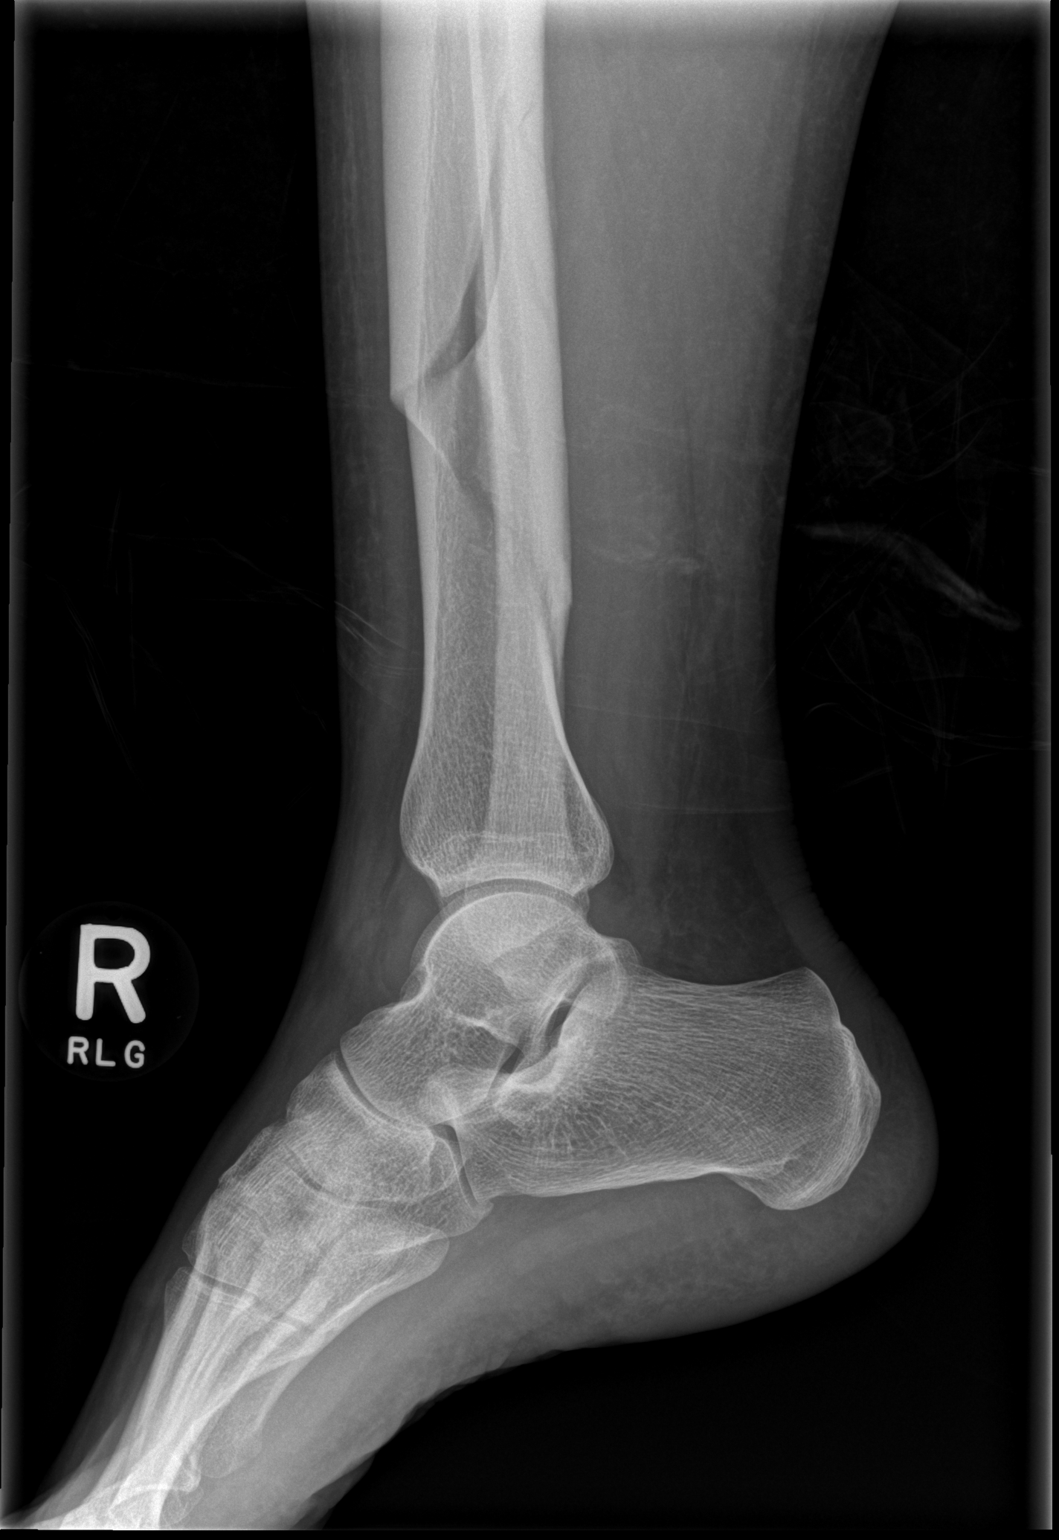

[x ankle ap right]
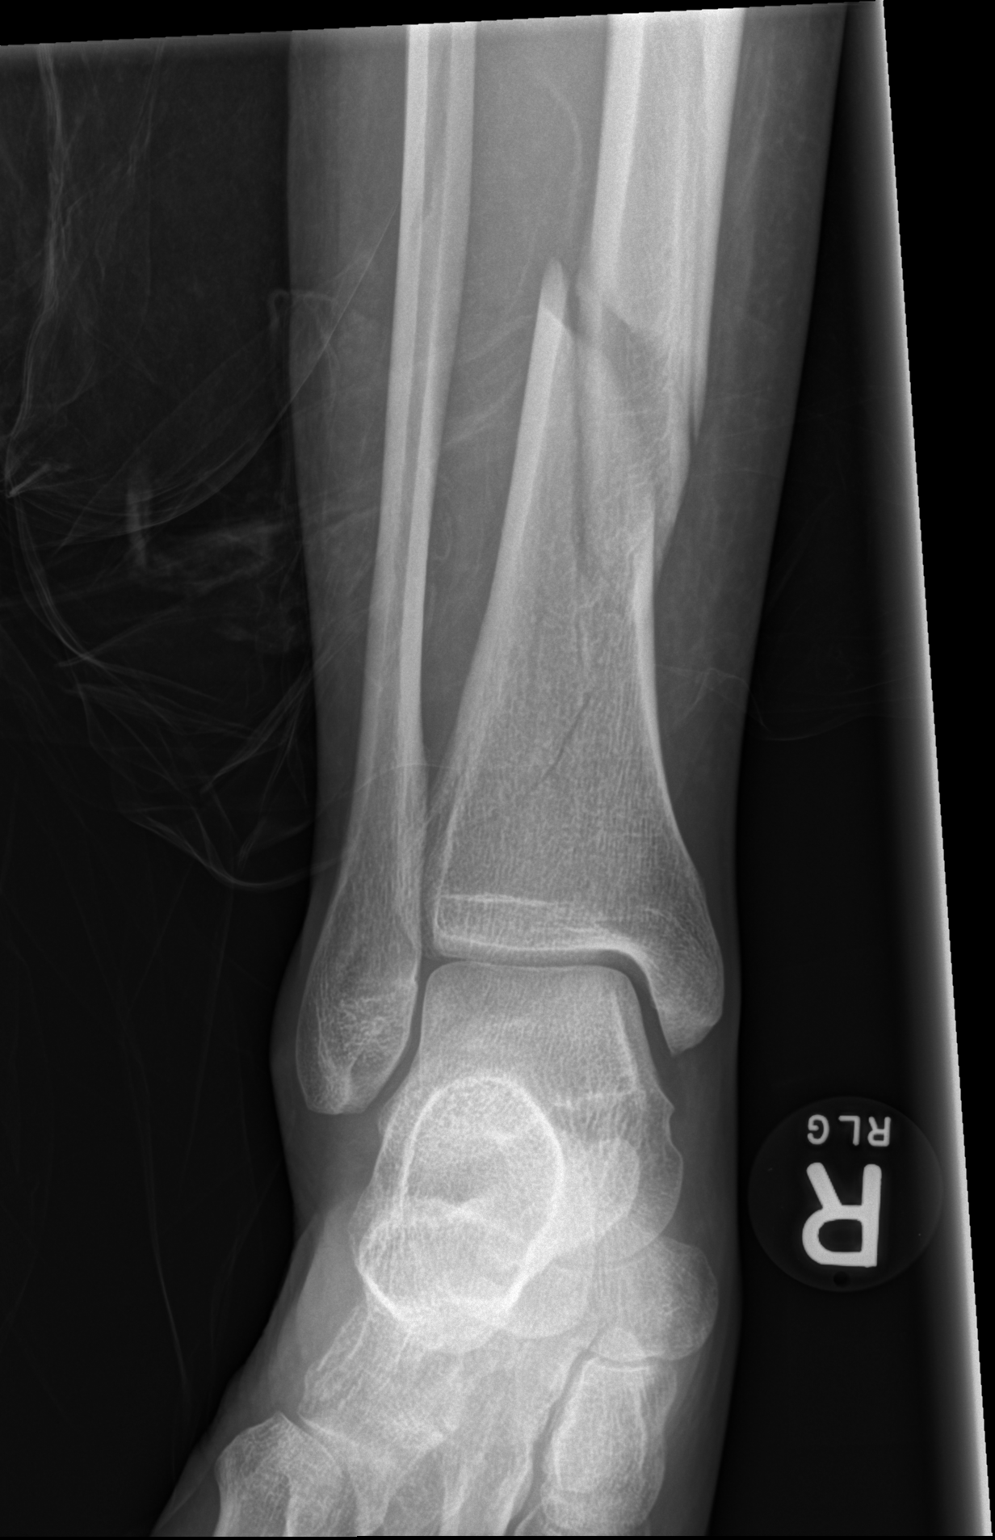

[x ankle obl right]
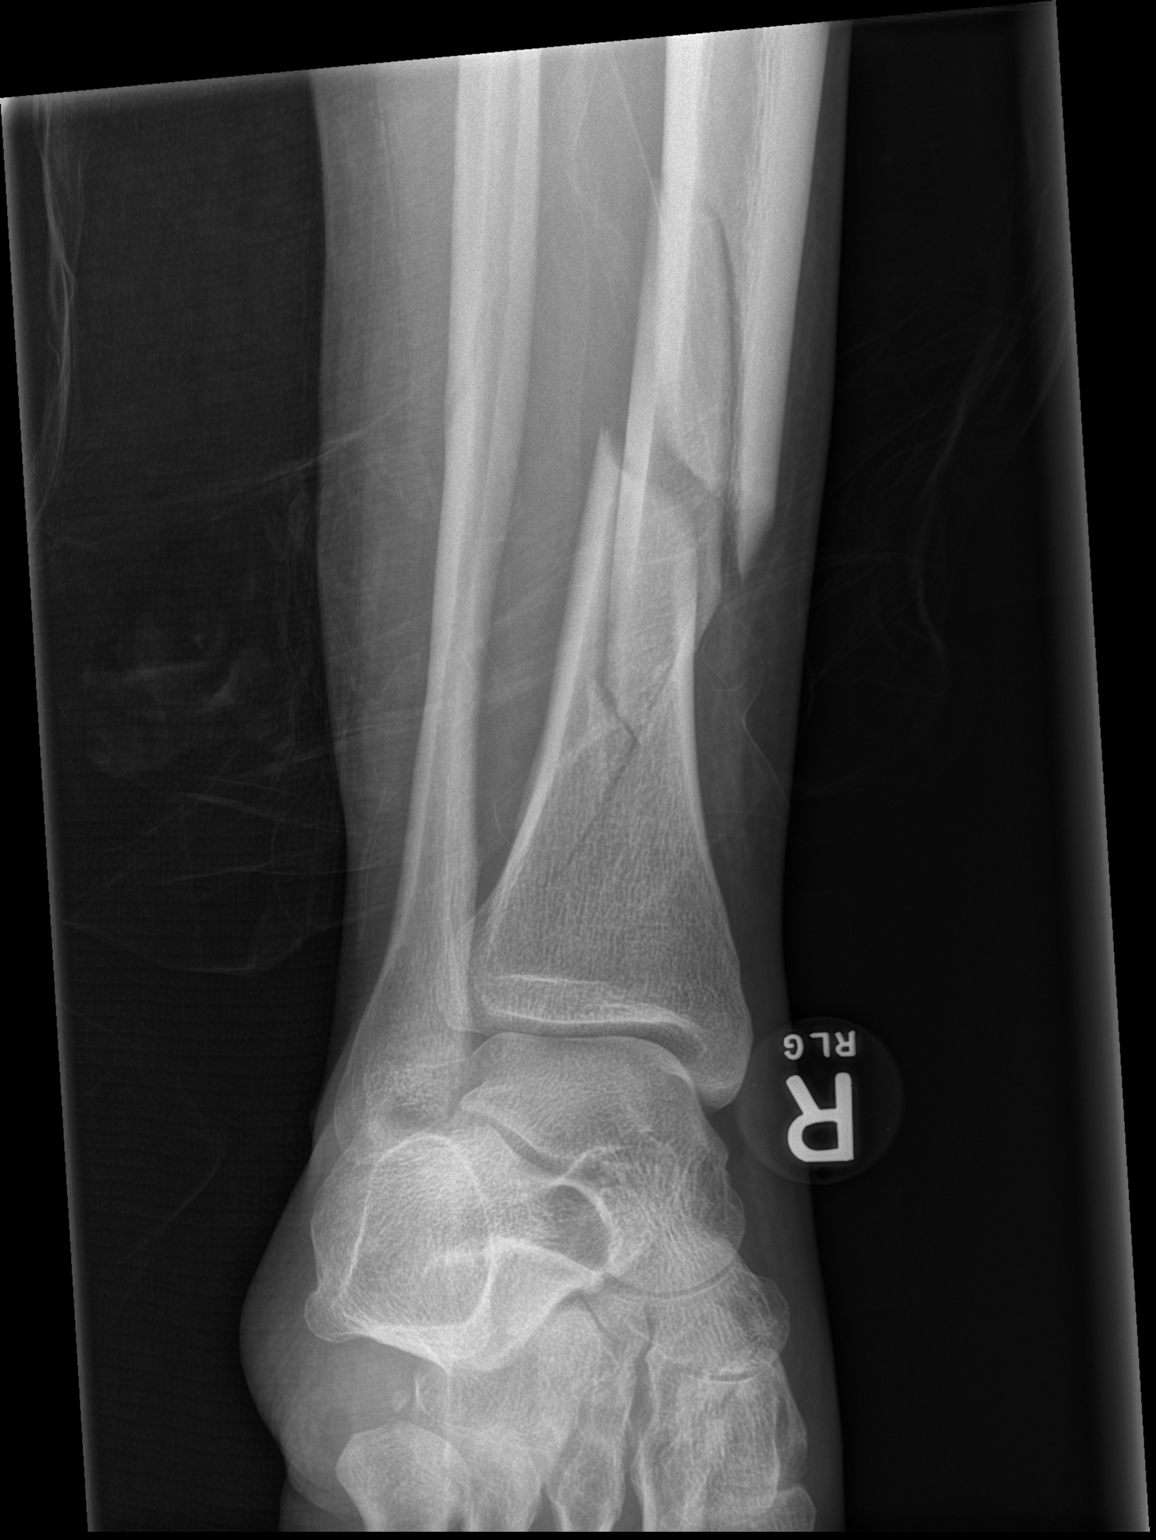

[3 of 3 positions shown; findings below may reference images not displayed]

FINDINGS: There is acute displaced spiral fracture of the right distal tibia
above the ankle. Ankle alignment maintained. No other joint
abnormality. Mild soft tissue swelling.
IMPRESSION: Acute displaced right distal tibia spiral fracture.

## 2021-05-17 ENCOUNTER — Ambulatory Visit: Payer: 59 | Attending: Nurse Practitioner | Admitting: Physical Therapy

## 2021-12-12 ENCOUNTER — Emergency Department (HOSPITAL_COMMUNITY)
Admission: EM | Admit: 2021-12-12 | Discharge: 2021-12-12 | Disposition: A | Discharge status: Home / Self Care | Payer: Medicaid Other | Attending: Emergency Medicine | Admitting: Emergency Medicine

## 2021-12-12 ENCOUNTER — Emergency Department (HOSPITAL_COMMUNITY): Payer: Medicaid Other

## 2021-12-12 ENCOUNTER — Other Ambulatory Visit: Payer: Self-pay

## 2021-12-12 DIAGNOSIS — Y92410 Unspecified street and highway as the place of occurrence of the external cause: Secondary | ICD-10-CM | POA: Insufficient documentation

## 2021-12-12 DIAGNOSIS — M542 Cervicalgia: Secondary | ICD-10-CM | POA: Insufficient documentation

## 2021-12-12 DIAGNOSIS — M25571 Pain in right ankle and joints of right foot: Secondary | ICD-10-CM | POA: Diagnosis present

## 2021-12-12 DIAGNOSIS — M549 Dorsalgia, unspecified: Secondary | ICD-10-CM | POA: Insufficient documentation

## 2021-12-12 DIAGNOSIS — Z5321 Procedure and treatment not carried out due to patient leaving prior to being seen by health care provider: Secondary | ICD-10-CM | POA: Insufficient documentation
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
## 2021-12-12 NOTE — ED Notes (Signed)
Patient left on own accord °

## 2024-04-16 ENCOUNTER — Ambulatory Visit (HOSPITAL_COMMUNITY)
Admission: EM | Admit: 2024-04-16 | Discharge: 2024-04-16 | Disposition: A | Discharge status: Home / Self Care | Payer: Self-pay | Attending: Emergency Medicine | Admitting: Emergency Medicine

## 2024-04-16 ENCOUNTER — Encounter (HOSPITAL_COMMUNITY): Payer: Self-pay

## 2024-04-16 DIAGNOSIS — N10 Acute tubulo-interstitial nephritis: Secondary | ICD-10-CM | POA: Insufficient documentation

## 2024-04-16 LAB — POCT URINALYSIS DIP (MANUAL ENTRY)
Bilirubin, UA: NEGATIVE
Glucose, UA: NEGATIVE mg/dL
Ketones, POC UA: NEGATIVE mg/dL
Nitrite, UA: POSITIVE — AB
Protein Ur, POC: 100 mg/dL — AB
Spec Grav, UA: 1.02 (ref 1.010–1.025)
Urobilinogen, UA: 1 U/dL
pH, UA: 7 (ref 5.0–8.0)

## 2024-04-16 MED ORDER — PHENAZOPYRIDINE HCL 95 MG PO TABS: 95.0000 mg | ORAL_TABLET | Freq: Three times a day (TID) | ORAL | 0 refills | Status: AC | PRN

## 2024-04-16 MED ORDER — CIPROFLOXACIN HCL 500 MG PO TABS: 500.0000 mg | ORAL_TABLET | Freq: Two times a day (BID) | ORAL | 0 refills | Status: AC

## 2024-04-16 MED ORDER — CEFTRIAXONE SODIUM 1 G IJ SOLR
INTRAMUSCULAR | Status: AC
  Filled 2024-04-16: qty 10

## 2024-04-16 MED ORDER — CEFTRIAXONE SODIUM 1 G IJ SOLR
1.0000 g | INTRAMUSCULAR | Status: DC
  Administered 2024-04-16: 1 g via INTRAMUSCULAR

## 2024-04-16 MED ORDER — LIDOCAINE HCL (PF) 1 % IJ SOLN
INTRAMUSCULAR | Status: AC
  Filled 2024-04-16: qty 2

## 2024-04-16 NOTE — ED Triage Notes (Signed)
 Pt c/o urinary frequency, urgency, and pressure x3 days. States now having rt flank pain.

## 2024-04-16 NOTE — Discharge Instructions (Addendum)
 I am concerned that the urinary tract infection has progressed up to your kidney.  We have given you an intramuscular injection of Rocephin .  It is important that you fill your antibiotics and take these daily for the next 7 days.  Untreated urinary tract infections that progress up to the kidneys can quickly lead to sepsis, which can lead to coma and death.  Ensure you are drinking at least 64 ounces of water  daily.  You can use over-the-counter AZO to help with the burning and dysuria.  Pure cranberry extract may help as well.  Return to clinic or seek immediate care for any new or worsening symptoms.
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
## 2024-04-20 ENCOUNTER — Ambulatory Visit (HOSPITAL_COMMUNITY): Payer: Self-pay

## 2024-04-20 LAB — URINE CULTURE: Culture: >=100000 — AB
